# Patient Record
Sex: Female | Born: 1960 | Race: White | Hispanic: No | Marital: Single | Smoking: Former smoker
Health system: Southern US, Community
[De-identification: ages and names within clinical notes are randomized; demographics above are authoritative.]

## PROBLEM LIST (undated history)

## (undated) DIAGNOSIS — J45909 Unspecified asthma, uncomplicated: Secondary | ICD-10-CM

## (undated) DIAGNOSIS — R7303 Prediabetes: Secondary | ICD-10-CM

## (undated) DIAGNOSIS — K589 Irritable bowel syndrome without diarrhea: Secondary | ICD-10-CM

## (undated) DIAGNOSIS — F4310 Post-traumatic stress disorder, unspecified: Secondary | ICD-10-CM

## (undated) DIAGNOSIS — R51 Headache: Secondary | ICD-10-CM

## (undated) DIAGNOSIS — H269 Unspecified cataract: Secondary | ICD-10-CM

## (undated) DIAGNOSIS — G709 Myoneural disorder, unspecified: Secondary | ICD-10-CM

## (undated) DIAGNOSIS — E785 Hyperlipidemia, unspecified: Secondary | ICD-10-CM

## (undated) DIAGNOSIS — M2550 Pain in unspecified joint: Secondary | ICD-10-CM

## (undated) DIAGNOSIS — R519 Headache, unspecified: Secondary | ICD-10-CM

## (undated) DIAGNOSIS — K8020 Calculus of gallbladder without cholecystitis without obstruction: Secondary | ICD-10-CM

## (undated) DIAGNOSIS — H3530 Unspecified macular degeneration: Secondary | ICD-10-CM

## (undated) DIAGNOSIS — M1990 Unspecified osteoarthritis, unspecified site: Secondary | ICD-10-CM

## (undated) DIAGNOSIS — F32A Depression, unspecified: Secondary | ICD-10-CM

## (undated) HISTORY — DX: Hyperlipidemia, unspecified: E78.5

## (undated) HISTORY — DX: Unspecified cataract: H26.9

## (undated) HISTORY — DX: Unspecified osteoarthritis, unspecified site: M19.90

## (undated) HISTORY — PX: OTHER SURGICAL HISTORY: SHX169

## (undated) HISTORY — DX: Morbid (severe) obesity due to excess calories: E66.01

## (undated) HISTORY — DX: Prediabetes: R73.03

## (undated) HISTORY — PX: KNEE SURGERY: SHX244

## (undated) HISTORY — PX: CHOLECYSTECTOMY: SHX55

## (undated) HISTORY — PX: HERNIA REPAIR: SHX51

## (undated) HISTORY — DX: Pain in unspecified joint: M25.50

## (undated) HISTORY — PX: TUBAL LIGATION: SHX77

## (undated) HISTORY — DX: Irritable bowel syndrome, unspecified: K58.9

---

## 2000-06-08 HISTORY — PX: KNEE SURGERY: SHX244

## 2006-08-18 ENCOUNTER — Emergency Department (HOSPITAL_COMMUNITY): Admission: EM | Admit: 2006-08-18 | Discharge: 2006-08-18 | Payer: Self-pay | Admitting: Emergency Medicine

## 2008-01-17 ENCOUNTER — Emergency Department (HOSPITAL_COMMUNITY): Admission: EM | Admit: 2008-01-17 | Discharge: 2008-01-18 | Payer: Self-pay | Admitting: Emergency Medicine

## 2008-03-13 ENCOUNTER — Emergency Department (HOSPITAL_COMMUNITY): Admission: EM | Admit: 2008-03-13 | Discharge: 2008-03-14 | Payer: Self-pay | Admitting: Emergency Medicine

## 2008-05-13 ENCOUNTER — Emergency Department (HOSPITAL_COMMUNITY): Admission: EM | Admit: 2008-05-13 | Discharge: 2008-05-13 | Payer: Self-pay | Admitting: Emergency Medicine

## 2008-06-16 ENCOUNTER — Emergency Department (HOSPITAL_COMMUNITY): Admission: EM | Admit: 2008-06-16 | Discharge: 2008-06-16 | Payer: Self-pay | Admitting: Emergency Medicine

## 2008-07-31 ENCOUNTER — Encounter: Admission: RE | Admit: 2008-07-31 | Discharge: 2008-07-31 | Payer: Self-pay | Admitting: Obstetrics and Gynecology

## 2009-01-16 ENCOUNTER — Emergency Department (HOSPITAL_COMMUNITY): Admission: EM | Admit: 2009-01-16 | Discharge: 2009-01-16 | Payer: Self-pay | Admitting: Emergency Medicine

## 2009-01-19 ENCOUNTER — Emergency Department (HOSPITAL_COMMUNITY): Admission: EM | Admit: 2009-01-19 | Discharge: 2009-01-19 | Payer: Self-pay | Admitting: Emergency Medicine

## 2010-07-20 ENCOUNTER — Emergency Department (HOSPITAL_COMMUNITY)
Admission: EM | Admit: 2010-07-20 | Discharge: 2010-07-20 | Disposition: A | Discharge status: Home / Self Care | Payer: Self-pay | Attending: Emergency Medicine | Admitting: Emergency Medicine

## 2010-07-20 DIAGNOSIS — R1110 Vomiting, unspecified: Secondary | ICD-10-CM | POA: Insufficient documentation

## 2010-07-20 DIAGNOSIS — R5381 Other malaise: Secondary | ICD-10-CM | POA: Insufficient documentation

## 2010-07-20 DIAGNOSIS — R5383 Other fatigue: Secondary | ICD-10-CM | POA: Insufficient documentation

## 2010-07-20 DIAGNOSIS — K5289 Other specified noninfective gastroenteritis and colitis: Secondary | ICD-10-CM | POA: Insufficient documentation

## 2010-07-20 DIAGNOSIS — R059 Cough, unspecified: Secondary | ICD-10-CM | POA: Insufficient documentation

## 2010-07-20 DIAGNOSIS — R05 Cough: Secondary | ICD-10-CM | POA: Insufficient documentation

## 2010-07-20 DIAGNOSIS — R197 Diarrhea, unspecified: Secondary | ICD-10-CM | POA: Insufficient documentation

## 2010-07-20 DIAGNOSIS — J309 Allergic rhinitis, unspecified: Secondary | ICD-10-CM | POA: Insufficient documentation

## 2011-01-08 ENCOUNTER — Other Ambulatory Visit: Payer: Self-pay | Admitting: Family Medicine

## 2011-01-08 DIAGNOSIS — Z1231 Encounter for screening mammogram for malignant neoplasm of breast: Secondary | ICD-10-CM

## 2011-01-14 ENCOUNTER — Ambulatory Visit
Admission: RE | Admit: 2011-01-14 | Discharge: 2011-01-14 | Disposition: A | Discharge status: Home / Self Care | Payer: BC Managed Care – PPO | Source: Ambulatory Visit | Attending: Family Medicine | Admitting: Family Medicine

## 2011-01-14 DIAGNOSIS — Z1231 Encounter for screening mammogram for malignant neoplasm of breast: Secondary | ICD-10-CM

## 2016-06-01 ENCOUNTER — Emergency Department (HOSPITAL_COMMUNITY): Payer: Self-pay

## 2016-06-01 ENCOUNTER — Encounter (HOSPITAL_COMMUNITY): Payer: Self-pay | Admitting: Emergency Medicine

## 2016-06-01 ENCOUNTER — Emergency Department (HOSPITAL_COMMUNITY)
Admission: EM | Admit: 2016-06-01 | Discharge: 2016-06-01 | Disposition: A | Discharge status: Home / Self Care | Payer: Self-pay | Attending: Emergency Medicine | Admitting: Emergency Medicine

## 2016-06-01 DIAGNOSIS — Z87891 Personal history of nicotine dependence: Secondary | ICD-10-CM | POA: Insufficient documentation

## 2016-06-01 DIAGNOSIS — Y9389 Activity, other specified: Secondary | ICD-10-CM | POA: Insufficient documentation

## 2016-06-01 DIAGNOSIS — J45909 Unspecified asthma, uncomplicated: Secondary | ICD-10-CM | POA: Insufficient documentation

## 2016-06-01 DIAGNOSIS — Y999 Unspecified external cause status: Secondary | ICD-10-CM | POA: Insufficient documentation

## 2016-06-01 DIAGNOSIS — Y929 Unspecified place or not applicable: Secondary | ICD-10-CM | POA: Insufficient documentation

## 2016-06-01 DIAGNOSIS — W010XXA Fall on same level from slipping, tripping and stumbling without subsequent striking against object, initial encounter: Secondary | ICD-10-CM | POA: Insufficient documentation

## 2016-06-01 DIAGNOSIS — S42295A Other nondisplaced fracture of upper end of left humerus, initial encounter for closed fracture: Secondary | ICD-10-CM | POA: Insufficient documentation

## 2016-06-01 HISTORY — DX: Unspecified asthma, uncomplicated: J45.909

## 2016-06-01 MED ORDER — IBUPROFEN 200 MG PO TABS
400.0000 mg | ORAL_TABLET | Freq: Once | ORAL | Status: AC | PRN
  Administered 2016-06-01: 400 mg via ORAL
  Filled 2016-06-01: qty 2

## 2016-06-01 MED ORDER — HYDROCODONE-ACETAMINOPHEN 5-325 MG PO TABS
1.0000 | ORAL_TABLET | Freq: Once | ORAL | Status: AC
  Administered 2016-06-01: 1 via ORAL
  Filled 2016-06-01: qty 1

## 2016-06-01 MED ORDER — HYDROCODONE-ACETAMINOPHEN 5-325 MG PO TABS: 2.0000 | ORAL_TABLET | ORAL | 0 refills | Status: DC | PRN

## 2016-06-01 MED ORDER — CYCLOBENZAPRINE HCL 10 MG PO TABS: 5.0000 mg | ORAL_TABLET | Freq: Two times a day (BID) | ORAL | 0 refills | Status: DC | PRN

## 2016-06-01 MED ORDER — ONDANSETRON 4 MG PO TBDP
4.0000 mg | ORAL_TABLET | Freq: Once | ORAL | Status: AC
  Administered 2016-06-01: 4 mg via ORAL
  Filled 2016-06-01: qty 1

## 2016-06-01 MED ORDER — ONDANSETRON HCL 4 MG PO TABS: 4.0000 mg | ORAL_TABLET | Freq: Four times a day (QID) | ORAL | 0 refills | Status: DC

## 2016-06-01 NOTE — ED Triage Notes (Signed)
Pt was chasing her dog outside today and fell on her left shoulder on the grass.  Pt ambulatory in triage.  Denies LOC. Slight swelling noted on exam.  No erythema or bruising to area.  No obvious deformity noted.

## 2016-06-01 NOTE — ED Notes (Signed)
Patient transported to X-ray 

## 2016-06-01 NOTE — ED Provider Notes (Signed)
[facility]-EMERGENCY DEPT Provider Note   CSN: [id number] Arrival date & time: 06/01/16  1940  By signing my name below, I, Stephanie Hensley, attest that this documentation has been prepared under the direction and in the presence of Stephanie Hensley Zondra Lawlor, PA-C. Electronically Signed: Doreatha MartinEva Hensley, ED Scribe. 06/01/16. 8:37 PM.    History   Chief Complaint Chief Complaint  Patient presents with  . Shoulder Pain    HPI Stephanie Hensley is a 55 y.o. female who presents to the Emergency Department complaining of moderate left shoulder pain s/p mechanical fall that occurred one hour ago. Pt states she was chasing her dog when she tripped and fell directly onto her left shoulder. She denies LOC or head injury. She reports mild relief of pain with ice applied in the ED. She denies left wrist pain, left elbow pain, numbness, additional injuries.    The history is provided by the patient. No language interpreter was used.    Past Medical History:  Diagnosis Date  . Asthma     There are no active problems to display for this patient.   Past Surgical History:  Procedure Laterality Date  . CHOLECYSTECTOMY    . KNEE SURGERY    . TUBAL LIGATION      OB History    No data available       Home Medications    Prior to Admission medications   Medication Sig Start Date End Date Taking? Authorizing Provider  cyclobenzaprine (FLEXERIL) 10 MG tablet Take 0.5-1 tablets (5-10 mg total) by mouth 2 (two) times daily as needed for muscle spasms. 06/01/16   Stephanie Hensley Deaira Leckey, PA-C  HYDROcodone-acetaminophen (NORCO/VICODIN) 5-325 MG tablet Take 2 tablets by mouth every 4 (four) hours as needed. 06/01/16   Ishita Mcnerney Neva SeatGreene, PA-C  ondansetron (ZOFRAN) 4 MG tablet Take 1 tablet (4 mg total) by mouth every 6 (six) hours. 06/01/16   Stephanie Hensley Shaya Reddick, PA-C    Family History No family history on file.  Social History Social History  Substance Use Topics  . Smoking status: Former Smoker    Types: Cigarettes  . Smokeless  tobacco: Never Used  . Alcohol use No     Allergies   Patient has no known allergies.   Review of Systems Review of Systems  Musculoskeletal: Positive for arthralgias.  Neurological: Negative for syncope and numbness.  All other systems reviewed and are negative.    Physical Exam Updated Vital Signs BP (!) 153/106 (BP Location: Right Arm)   Pulse 94   Temp 98.4 F (36.9 C) (Oral)   Resp 20   Ht 5\' 5"  (1.651 m)   Wt 129 kg   LMP 12/24/2010   SpO2 98%   BMI 47.33 kg/m   Physical Exam  Constitutional: She appears well-developed and well-nourished.  HENT:  Head: Normocephalic.  Eyes: Conjunctivae are normal.  Cardiovascular: Normal rate.   Pulmonary/Chest: Effort normal. No respiratory distress.  Abdominal: She exhibits no distension.  Musculoskeletal: Normal range of motion. She exhibits tenderness.  Swelling, tenderness and decreased ROM to the left shoulder. No skin tinting. No pain to elbow, wrist or hand. Intact sensation and pulses. FROM of all 5 fingers.   Neurological: She is alert.  Skin: Skin is warm and dry.  Psychiatric: She has a normal mood and affect. Her behavior is normal.  Nursing note and vitals reviewed.    ED Treatments / Results   DIAGNOSTIC STUDIES: Oxygen Saturation is 98% on RA, normal by my interpretation.    COORDINATION OF CARE:  8:31 PM Discussed treatment plan with pt at bedside which includes XR, ibuprofen and pt agreed to plan.  Labs (all labs ordered are listed, but only abnormal results are displayed) Labs Reviewed - No data to display  EKG  EKG Interpretation None       Radiology Dg Shoulder Left  Result Date: 06/01/2016 CLINICAL DATA:  Recent fall with left shoulder pain, initial encounter EXAM: LEFT SHOULDER - 2+ VIEW COMPARISON:  None. FINDINGS: There is comminuted fracture of the left proximal humerus. It appears to involve the greater tuberosity as well as the anatomic neck. No dislocation of the humeral head  from the glenoid is noted. No other focal abnormality is seen. IMPRESSION: Comminuted proximal left humeral fracture. Electronically Signed   By: Alcide CleverMark  Lukens M.D.   On: 06/01/2016 20:14    Procedures Procedures (including critical care time)  Medications Ordered in ED Medications  HYDROcodone-acetaminophen (NORCO/VICODIN) 5-325 MG per tablet 1-2 tablet (not administered)  ondansetron (ZOFRAN-ODT) disintegrating tablet 4 mg (not administered)  ibuprofen (ADVIL,MOTRIN) tablet 400 mg (400 mg Oral Given 06/01/16 1955)     Initial Impression / Assessment and Plan / ED Course  I have reviewed the triage vital signs and the nursing notes.  Pertinent labs & imaging results that were available during my care of the patient were reviewed by me and considered in my medical decision making (see chart for details).  Clinical Course     Patient X-Ray shows comminuted proximal left humeral fracture.  Pt advised to follow up with orthopedics- told this may be a surgical fracture and that she needs to follow up to reduce risk of complications. Patient given ibuprofen, Vicodin while in ED, conservative therapy recommended and discussed. Patient will be discharged home with antiinflammatories and shoulder sling & is agreeable with above plan. Returns precautions discussed. Pt appears safe for discharge.    Final Clinical Impressions(s) / ED Diagnoses   Final diagnoses:  Other closed nondisplaced fracture of proximal end of left humerus, initial encounter    New Prescriptions New Prescriptions   CYCLOBENZAPRINE (FLEXERIL) 10 MG TABLET    Take 0.5-1 tablets (5-10 mg total) by mouth 2 (two) times daily as needed for muscle spasms.   HYDROCODONE-ACETAMINOPHEN (NORCO/VICODIN) 5-325 MG TABLET    Take 2 tablets by mouth every 4 (four) hours as needed.   ONDANSETRON (ZOFRAN) 4 MG TABLET    Take 1 tablet (4 mg total) by mouth every 6 (six) hours.   I personally performed the services described in this  documentation, which was scribed in my presence. The recorded information has been reviewed and is accurate.    Stephanie Hensley [name], PA-C 06/01/16 2109    Rolan BuccoMelanie Belfi, MD 06/01/16 2155

## 2016-06-17 ENCOUNTER — Encounter (HOSPITAL_COMMUNITY): Payer: Self-pay | Admitting: *Deleted

## 2016-06-17 NOTE — Progress Notes (Signed)
Spoke with pt for pre-op call. Pt denies cardiac history, chest pain or sob. 

## 2016-06-18 ENCOUNTER — Inpatient Hospital Stay (HOSPITAL_COMMUNITY): Payer: Self-pay | Admitting: Anesthesiology

## 2016-06-18 ENCOUNTER — Observation Stay (HOSPITAL_COMMUNITY): Payer: Self-pay

## 2016-06-18 ENCOUNTER — Encounter (HOSPITAL_COMMUNITY): Payer: Self-pay | Admitting: *Deleted

## 2016-06-18 ENCOUNTER — Observation Stay (HOSPITAL_COMMUNITY)
Admission: RE | Admit: 2016-06-18 | Discharge: 2016-06-19 | Disposition: A | Discharge status: Home / Self Care | Payer: Self-pay | Source: Ambulatory Visit | Attending: Orthopedic Surgery | Admitting: Orthopedic Surgery

## 2016-06-18 ENCOUNTER — Encounter (HOSPITAL_COMMUNITY)
Admission: RE | Disposition: A | Discharge status: Home / Self Care | Payer: Self-pay | Source: Ambulatory Visit | Attending: Orthopedic Surgery

## 2016-06-18 DIAGNOSIS — S42242A 4-part fracture of surgical neck of left humerus, initial encounter for closed fracture: Principal | ICD-10-CM | POA: Insufficient documentation

## 2016-06-18 DIAGNOSIS — J45909 Unspecified asthma, uncomplicated: Secondary | ICD-10-CM | POA: Insufficient documentation

## 2016-06-18 DIAGNOSIS — W19XXXA Unspecified fall, initial encounter: Secondary | ICD-10-CM | POA: Insufficient documentation

## 2016-06-18 DIAGNOSIS — S42292A Other displaced fracture of upper end of left humerus, initial encounter for closed fracture: Secondary | ICD-10-CM

## 2016-06-18 DIAGNOSIS — Z87891 Personal history of nicotine dependence: Secondary | ICD-10-CM | POA: Insufficient documentation

## 2016-06-18 DIAGNOSIS — F4310 Post-traumatic stress disorder, unspecified: Secondary | ICD-10-CM | POA: Insufficient documentation

## 2016-06-18 HISTORY — DX: Post-traumatic stress disorder, unspecified: F43.10

## 2016-06-18 HISTORY — DX: Headache: R51

## 2016-06-18 HISTORY — DX: Headache, unspecified: R51.9

## 2016-06-18 HISTORY — PX: SHOULDER HEMI-ARTHROPLASTY: SHX5049

## 2016-06-18 LAB — CBC
HCT: 36.9 % (ref 36.0–46.0)
HCT: 34.9 % — ABNORMAL LOW (ref 36.0–46.0)
Hemoglobin: 12 g/dL (ref 12.0–15.0)
Hemoglobin: 11.1 g/dL — ABNORMAL LOW (ref 12.0–15.0)
MCH: 29.1 pg (ref 26.0–34.0)
MCH: 28.8 pg (ref 26.0–34.0)
MCHC: 32.5 g/dL (ref 30.0–36.0)
MCHC: 31.8 g/dL (ref 30.0–36.0)
MCV: 89.6 fL (ref 78.0–100.0)
MCV: 90.4 fL (ref 78.0–100.0)
PLATELETS: 286 10*3/uL (ref 150–400)
PLATELETS: 250 10*3/uL (ref 150–400)
RBC: 4.12 MIL/uL (ref 3.87–5.11)
RBC: 3.86 MIL/uL — ABNORMAL LOW (ref 3.87–5.11)
RDW: 13.1 % (ref 11.5–15.5)
RDW: 13.3 % (ref 11.5–15.5)
WBC: 7.6 10*3/uL (ref 4.0–10.5)
WBC: 11.7 10*3/uL — ABNORMAL HIGH (ref 4.0–10.5)

## 2016-06-18 LAB — CREATININE, SERUM
Creatinine, Ser: 0.81 mg/dL (ref 0.44–1.00)
GFR calc Af Amer: >60 mL/min
GFR calc non Af Amer: >60 mL/min

## 2016-06-18 SURGERY — HEMIARTHROPLASTY, SHOULDER
Anesthesia: General | Site: Shoulder | Laterality: Left

## 2016-06-18 MED ORDER — DOCUSATE SODIUM 100 MG PO CAPS
100.0000 mg | ORAL_CAPSULE | Freq: Two times a day (BID) | ORAL | Status: DC
  Filled 2016-06-18: qty 1

## 2016-06-18 MED ORDER — ROCURONIUM BROMIDE 50 MG/5ML IV SOSY
PREFILLED_SYRINGE | INTRAVENOUS | Status: AC
  Filled 2016-06-18: qty 25

## 2016-06-18 MED ORDER — PROPOFOL 10 MG/ML IV BOLUS
INTRAVENOUS | Status: DC | PRN
  Administered 2016-06-18: 200 mg via INTRAVENOUS

## 2016-06-18 MED ORDER — MIDAZOLAM HCL 2 MG/2ML IJ SOLN
INTRAMUSCULAR | Status: AC
  Filled 2016-06-18: qty 2

## 2016-06-18 MED ORDER — LACTATED RINGERS IV SOLN
INTRAVENOUS | Status: DC | PRN
  Administered 2016-06-18 (×2): via INTRAVENOUS

## 2016-06-18 MED ORDER — BUPIVACAINE HCL (PF) 0.25 % IJ SOLN
INTRAMUSCULAR | Status: DC | PRN
  Administered 2016-06-18: 20 mL

## 2016-06-18 MED ORDER — PROPOFOL 10 MG/ML IV BOLUS
INTRAVENOUS | Status: AC
  Filled 2016-06-18: qty 20

## 2016-06-18 MED ORDER — HYDROMORPHONE HCL 1 MG/ML IJ SOLN
INTRAMUSCULAR | Status: AC
  Filled 2016-06-18: qty 1

## 2016-06-18 MED ORDER — ONDANSETRON HCL 4 MG/2ML IJ SOLN: 4.0000 mg | Freq: Four times a day (QID) | INTRAMUSCULAR | Status: DC | PRN

## 2016-06-18 MED ORDER — PHENYLEPHRINE HCL 10 MG/ML IJ SOLN
INTRAMUSCULAR | Status: AC
  Filled 2016-06-18: qty 1

## 2016-06-18 MED ORDER — ONDANSETRON HCL 4 MG PO TABS: 4.0000 mg | ORAL_TABLET | Freq: Four times a day (QID) | ORAL | Status: DC | PRN

## 2016-06-18 MED ORDER — FENTANYL CITRATE (PF) 100 MCG/2ML IJ SOLN
INTRAMUSCULAR | Status: AC
  Filled 2016-06-18: qty 2

## 2016-06-18 MED ORDER — LIDOCAINE HCL (CARDIAC) 20 MG/ML IV SOLN
INTRAVENOUS | Status: DC | PRN
  Administered 2016-06-18: 80 mg via INTRAVENOUS

## 2016-06-18 MED ORDER — MIDAZOLAM HCL 5 MG/5ML IJ SOLN
INTRAMUSCULAR | Status: DC | PRN
  Administered 2016-06-18: 2 mg via INTRAVENOUS

## 2016-06-18 MED ORDER — ONDANSETRON HCL 4 MG/2ML IJ SOLN
INTRAMUSCULAR | Status: DC | PRN
  Administered 2016-06-18: 4 mg via INTRAVENOUS

## 2016-06-18 MED ORDER — MORPHINE SULFATE (PF) 2 MG/ML IV SOLN
2.0000 mg | INTRAVENOUS | Status: DC | PRN
Start: 2016-06-18 — End: 2016-06-19

## 2016-06-18 MED ORDER — DEXAMETHASONE SODIUM PHOSPHATE 10 MG/ML IJ SOLN
INTRAMUSCULAR | Status: DC | PRN
  Administered 2016-06-18: 10 mg via INTRAVENOUS

## 2016-06-18 MED ORDER — SODIUM CHLORIDE 0.9 % IR SOLN
Status: DC | PRN
Start: 2016-06-18 — End: 2016-06-18
  Administered 2016-06-18: 1000 mL

## 2016-06-18 MED ORDER — ACETAMINOPHEN 650 MG RE SUPP: 650.0000 mg | Freq: Four times a day (QID) | RECTAL | Status: DC | PRN

## 2016-06-18 MED ORDER — OXYCODONE HCL 5 MG PO TABS
5.0000 mg | ORAL_TABLET | ORAL | Status: DC | PRN
  Administered 2016-06-18 (×2): 10 mg via ORAL
  Administered 2016-06-19 (×2): 5 mg via ORAL
  Administered 2016-06-19: 10 mg via ORAL
  Filled 2016-06-18 (×2): qty 2
  Filled 2016-06-18: qty 1
  Filled 2016-06-18: qty 2
  Filled 2016-06-18: qty 1

## 2016-06-18 MED ORDER — SUGAMMADEX SODIUM 500 MG/5ML IV SOLN
INTRAVENOUS | Status: DC | PRN
  Administered 2016-06-18: 500 mg via INTRAVENOUS

## 2016-06-18 MED ORDER — LORATADINE 10 MG PO TABS
10.0000 mg | ORAL_TABLET | Freq: Every day | ORAL | Status: DC
  Administered 2016-06-19: 10 mg via ORAL
  Filled 2016-06-18 (×2): qty 1

## 2016-06-18 MED ORDER — FENTANYL CITRATE (PF) 100 MCG/2ML IJ SOLN
INTRAMUSCULAR | Status: DC | PRN
  Administered 2016-06-18: 50 ug via INTRAVENOUS
  Administered 2016-06-18: 100 ug via INTRAVENOUS
  Administered 2016-06-18: 50 ug via INTRAVENOUS

## 2016-06-18 MED ORDER — ACETAMINOPHEN 10 MG/ML IV SOLN
INTRAVENOUS | Status: AC
  Filled 2016-06-18: qty 100

## 2016-06-18 MED ORDER — ACETAMINOPHEN 325 MG PO TABS
650.0000 mg | ORAL_TABLET | Freq: Four times a day (QID) | ORAL | Status: DC | PRN
Start: 2016-06-18 — End: 2016-06-19

## 2016-06-18 MED ORDER — HYDROMORPHONE HCL 1 MG/ML IJ SOLN
0.2500 mg | INTRAMUSCULAR | Status: DC | PRN
  Administered 2016-06-18: 0.5 mg via INTRAVENOUS

## 2016-06-18 MED ORDER — ENOXAPARIN SODIUM 40 MG/0.4ML ~~LOC~~ SOLN
40.0000 mg | SUBCUTANEOUS | Status: DC
  Administered 2016-06-19: 40 mg via SUBCUTANEOUS
  Filled 2016-06-18: qty 0.4

## 2016-06-18 MED ORDER — BUPIVACAINE HCL (PF) 0.25 % IJ SOLN
INTRAMUSCULAR | Status: AC
  Filled 2016-06-18: qty 30

## 2016-06-18 MED ORDER — CHLORHEXIDINE GLUCONATE 4 % EX LIQD: 60.0000 mL | Freq: Once | CUTANEOUS | Status: DC

## 2016-06-18 MED ORDER — ACETAMINOPHEN 500 MG PO TABS
1000.0000 mg | ORAL_TABLET | Freq: Four times a day (QID) | ORAL | Status: DC
  Administered 2016-06-18 – 2016-06-19 (×3): 1000 mg via ORAL
  Filled 2016-06-18 (×4): qty 2

## 2016-06-18 MED ORDER — SUCCINYLCHOLINE CHLORIDE 200 MG/10ML IV SOSY
PREFILLED_SYRINGE | INTRAVENOUS | Status: AC
  Filled 2016-06-18: qty 10

## 2016-06-18 MED ORDER — ACETAMINOPHEN 10 MG/ML IV SOLN
INTRAVENOUS | Status: DC | PRN
  Administered 2016-06-18: 1000 mg via INTRAVENOUS

## 2016-06-18 MED ORDER — DEXTROSE 5 % IV SOLN
3.0000 g | INTRAVENOUS | Status: AC
  Administered 2016-06-18: 2 g via INTRAVENOUS
  Filled 2016-06-18: qty 3000

## 2016-06-18 MED ORDER — SUCCINYLCHOLINE CHLORIDE 20 MG/ML IJ SOLN
INTRAMUSCULAR | Status: DC | PRN
  Administered 2016-06-18: 100 mg via INTRAVENOUS

## 2016-06-18 MED ORDER — SUGAMMADEX SODIUM 500 MG/5ML IV SOLN
INTRAVENOUS | Status: AC
  Filled 2016-06-18: qty 5

## 2016-06-18 MED ORDER — ALBUTEROL SULFATE (2.5 MG/3ML) 0.083% IN NEBU: 3.0000 mL | INHALATION_SOLUTION | Freq: Four times a day (QID) | RESPIRATORY_TRACT | Status: DC | PRN

## 2016-06-18 MED ORDER — DEXAMETHASONE SODIUM PHOSPHATE 10 MG/ML IJ SOLN
INTRAMUSCULAR | Status: AC
  Filled 2016-06-18: qty 1

## 2016-06-18 MED ORDER — CYCLOBENZAPRINE HCL 5 MG PO TABS
5.0000 mg | ORAL_TABLET | Freq: Three times a day (TID) | ORAL | Status: DC | PRN
  Filled 2016-06-18: qty 2

## 2016-06-18 MED ORDER — LIDOCAINE 2% (20 MG/ML) 5 ML SYRINGE
INTRAMUSCULAR | Status: AC
  Filled 2016-06-18: qty 10

## 2016-06-18 MED ORDER — EPHEDRINE 5 MG/ML INJ
INTRAVENOUS | Status: AC
  Filled 2016-06-18: qty 10

## 2016-06-18 MED ORDER — ROCURONIUM BROMIDE 100 MG/10ML IV SOLN
INTRAVENOUS | Status: DC | PRN
  Administered 2016-06-18: 20 mg via INTRAVENOUS
  Administered 2016-06-18: 30 mg via INTRAVENOUS
  Administered 2016-06-18: 20 mg via INTRAVENOUS
  Administered 2016-06-18: 10 mg via INTRAVENOUS
  Administered 2016-06-18: 20 mg via INTRAVENOUS

## 2016-06-18 MED ORDER — PHENYLEPHRINE HCL 10 MG/ML IJ SOLN
INTRAVENOUS | Status: DC | PRN
  Administered 2016-06-18: 25 ug/min via INTRAVENOUS

## 2016-06-18 MED ORDER — LACTATED RINGERS IV SOLN
INTRAVENOUS | Status: DC
  Administered 2016-06-18: 10:00:00 via INTRAVENOUS

## 2016-06-18 MED ORDER — MIDAZOLAM HCL 5 MG/5ML IJ SOLN: INTRAMUSCULAR | Status: DC | PRN

## 2016-06-18 SURGICAL SUPPLY — 59 items
BLADE SAW SAG 73X25 THK (BLADE) ×1
BLADE SAW SGTL 73X25 THK (BLADE) ×1 IMPLANT
BONE CEMENT PALACOS R-G (Cement) ×2 IMPLANT
BOWL SMART MIX CTS (DISPOSABLE) ×2 IMPLANT
CAPT SHOULDER PARTIAL 2 ×1 IMPLANT
CEMENT BONE PALACOS R-G (Cement) IMPLANT
COVER SURGICAL LIGHT HANDLE (MISCELLANEOUS) ×2 IMPLANT
DRAPE IMP U-DRAPE 54X76 (DRAPES) ×2 IMPLANT
DRAPE INCISE IOBAN 66X45 STRL (DRAPES) ×4 IMPLANT
DRAPE U-SHAPE 47X51 STRL (DRAPES) ×2 IMPLANT
DRILL BIT 5/64 (BIT) ×2 IMPLANT
DRSG ADAPTIC 3X8 NADH LF (GAUZE/BANDAGES/DRESSINGS) ×2 IMPLANT
DRSG PAD ABDOMINAL 8X10 ST (GAUZE/BANDAGES/DRESSINGS) ×4 IMPLANT
DURAPREP 26ML APPLICATOR (WOUND CARE) ×2 IMPLANT
ELECT NDL TIP 2.8 STRL (NEEDLE) ×1 IMPLANT
ELECT NEEDLE TIP 2.8 STRL (NEEDLE) ×2 IMPLANT
ELECT REM PT RETURN 9FT ADLT (ELECTROSURGICAL) ×2
ELECTRODE REM PT RTRN 9FT ADLT (ELECTROSURGICAL) ×1 IMPLANT
GAUZE SPONGE 4X4 12PLY STRL (GAUZE/BANDAGES/DRESSINGS) ×2 IMPLANT
GAUZE XEROFORM 1X8 LF (GAUZE/BANDAGES/DRESSINGS) ×1 IMPLANT
GLOVE BIOGEL PI ORTHO PRO 7.5 (GLOVE) ×1
GLOVE BIOGEL PI ORTHO PRO SZ8 (GLOVE) ×1
GLOVE ORTHO TXT STRL SZ7.5 (GLOVE) ×2 IMPLANT
GLOVE PI ORTHO PRO STRL 7.5 (GLOVE) ×1 IMPLANT
GLOVE PI ORTHO PRO STRL SZ8 (GLOVE) ×1 IMPLANT
GLOVE SURG ORTHO 8.5 STRL (GLOVE) ×2 IMPLANT
GOWN STRL REUS W/ TWL XL LVL3 (GOWN DISPOSABLE) ×2 IMPLANT
GOWN STRL REUS W/TWL XL LVL3 (GOWN DISPOSABLE) ×4
KIT BASIN OR (CUSTOM PROCEDURE TRAY) ×2 IMPLANT
KIT ROOM TURNOVER OR (KITS) ×2 IMPLANT
MANIFOLD NEPTUNE II (INSTRUMENTS) ×2 IMPLANT
NDL HYPO 25GX1X1/2 BEV (NEEDLE) ×1 IMPLANT
NDL SUT .5 MAYO 1.404X.05X (NEEDLE) ×1 IMPLANT
NEEDLE HYPO 25GX1X1/2 BEV (NEEDLE) ×2 IMPLANT
NEEDLE MAYO TAPER (NEEDLE) ×2
NS IRRIG 1000ML POUR BTL (IV SOLUTION) ×2 IMPLANT
PACK SHOULDER (CUSTOM PROCEDURE TRAY) ×2 IMPLANT
PACK UNIVERSAL I (CUSTOM PROCEDURE TRAY) ×2 IMPLANT
PAD ARMBOARD 7.5X6 YLW CONV (MISCELLANEOUS) ×4 IMPLANT
SLING ARM IMMOBILIZER LRG (SOFTGOODS) ×2 IMPLANT
SLING ARM IMMOBILIZER MED (SOFTGOODS) IMPLANT
SPONGE GAUZE 4X4 12PLY STER LF (GAUZE/BANDAGES/DRESSINGS) ×1 IMPLANT
SPONGE LAP 18X18 X RAY DECT (DISPOSABLE) ×2 IMPLANT
STRIP CLOSURE SKIN 1/2X4 (GAUZE/BANDAGES/DRESSINGS) ×2 IMPLANT
SUCTION FRAZIER HANDLE 10FR (MISCELLANEOUS) ×1
SUCTION TUBE FRAZIER 10FR DISP (MISCELLANEOUS) ×1 IMPLANT
SUT FIBERWIRE #2 38 T-5 BLUE (SUTURE) ×10
SUT MNCRL AB 4-0 PS2 18 (SUTURE) ×2 IMPLANT
SUT VIC AB 0 CT1 27 (SUTURE) ×2
SUT VIC AB 0 CT1 27XBRD ANBCTR (SUTURE) ×1 IMPLANT
SUT VIC AB 2-0 CT1 27 (SUTURE) ×4
SUT VIC AB 2-0 CT1 TAPERPNT 27 (SUTURE) ×1 IMPLANT
SUTURE FIBERWR #2 38 T-5 BLUE (SUTURE) ×2 IMPLANT
SYR CONTROL 10ML LL (SYRINGE) ×2 IMPLANT
TOWEL OR 17X24 6PK STRL BLUE (TOWEL DISPOSABLE) ×2 IMPLANT
TOWEL OR 17X26 10 PK STRL BLUE (TOWEL DISPOSABLE) ×2 IMPLANT
TRAY FOLEY CATH 16FRSI W/METER (SET/KITS/TRAYS/PACK) IMPLANT
WATER STERILE IRR 1000ML POUR (IV SOLUTION) ×2 IMPLANT
YANKAUER SUCT BULB TIP NO VENT (SUCTIONS) ×1 IMPLANT

## 2016-06-18 NOTE — H&P (Signed)
ORTHOPAEDIC H and P  REQUESTING PHYSICIAN: Yolonda KidaJason Patrick Donnivan Villena, MD  PCP:  No PCP Per Patient  Chief Complaint: Left shoulder pain/fracture  HPI: Stephanie Hensley is a 56 y.o. female who complains of  Left proximal humerus fracture.  She presents today for operative management of the left shoulder. She has been having consistent pain but no distal neuro deficits and is ready for surgery today.    Past Medical History:  Diagnosis Date  . Asthma   . Headache    migraines in the past  . PTSD (post-traumatic stress disorder)    Past Surgical History:  Procedure Laterality Date  . CHOLECYSTECTOMY    . HERNIA REPAIR     umbilical hernia repair  . KNEE SURGERY    . TUBAL LIGATION     Social History   Social History  . Marital status: Single    Spouse name: N/A  . Number of children: N/A  . Years of education: N/A   Social History Main Topics  . Smoking status: Former Smoker    Types: Cigarettes    Quit date: 06/17/2012  . Smokeless tobacco: Never Used  . Alcohol use No  . Drug use: No  . Sexual activity: Not Asked   Other Topics Concern  . None   Social History Narrative  . None   Family History  Problem Relation Age of Onset  . Cancer Mother   . Heart disease Father    No Known Allergies Prior to Admission medications   Medication Sig Start Date End Date Taking? Authorizing Provider  albuterol (PROVENTIL HFA;VENTOLIN HFA) 108 (90 Base) MCG/ACT inhaler Inhale 1-2 puffs into the lungs every 6 (six) hours as needed for wheezing or shortness of breath.   Yes Historical Provider, MD  cetirizine (ZYRTEC) 10 MG tablet Take 10 mg by mouth daily.   Yes Historical Provider, MD  cyclobenzaprine (FLEXERIL) 10 MG tablet Take 0.5-1 tablets (5-10 mg total) by mouth 2 (two) times daily as needed for muscle spasms. 06/01/16  Yes Tiffany Neva SeatGreene, PA-C  diphenhydrAMINE (BENADRYL) 25 MG tablet Take 25 mg by mouth every 6 (six) hours as needed. Pt taking 1 a day for itching caused by  Hydrocodone   Yes Historical Provider, MD  HYDROcodone-acetaminophen (NORCO/VICODIN) 5-325 MG tablet Take 2 tablets by mouth every 4 (four) hours as needed. Patient taking differently: Take 2 tablets by mouth every 4 (four) hours as needed (for pain).  06/01/16  Yes Tiffany Neva SeatGreene, PA-C  ondansetron (ZOFRAN) 4 MG tablet Take 1 tablet (4 mg total) by mouth every 6 (six) hours. Patient not taking: Reported on 06/12/2016 06/01/16   Marlon Peliffany Greene, PA-C   No results found.  Positive ROS: All other systems have been reviewed and were otherwise negative with the exception of those mentioned in the HPI and as above.  Physical Exam: General: Alert, no acute distress Cardiovascular: No pedal edema Respiratory: No cyanosis, no use of accessory musculature GI: No organomegaly, abdomen is soft and non-tender Skin: No lesions in the area of chief complaint Neurologic: Sensation intact distally Psychiatric: Patient is competent for consent with normal mood and affect Lymphatic: No axillary or cervical lymphadenopathy  MUSCULOSKELETAL:  LUE- TTP proximally, otherwise nvi distally   Assessment: Left 4 part proximal humerus fracture with head splitting component  Plan: -to OR today for Left shoulder hemiarthroplasty -The risks, benefits, and alternatives were discussed with the patient. There are risks associated with the surgery including, but not limited to, problems with anesthesia (death),  infection, differences in leg length/angulation/rotation, fracture of bones, loosening or failure of implants, malunion, nonunion, hematoma (blood accumulation) which may require surgical drainage, blood clots, pulmonary embolism, nerve injury (foot drop), and blood vessel injury. The patient understands these risks and elects to proceed. -admit to obs post op     Yolonda Kida, MD Cell [phone]    06/18/2016 12:12 PM

## 2016-06-18 NOTE — Anesthesia Procedure Notes (Signed)
Procedure Name: Intubation Date/Time: 06/18/2016 12:31 PM Performed by: Neldon Newport Pre-anesthesia Checklist: Timeout performed, Patient being monitored, Suction available, Emergency Drugs available and Patient identified Patient Re-evaluated:Patient Re-evaluated prior to inductionOxygen Delivery Method: Circle system utilized Preoxygenation: Pre-oxygenation with 100% oxygen Intubation Type: IV induction and Rapid sequence Ventilation: Mask ventilation without difficulty and Oral airway inserted - appropriate to patient size Laryngoscope Size: Mac and 3 Grade View: Grade I Tube type: Oral Tube size: 7.0 mm Number of attempts: 1 Placement Confirmation: breath sounds checked- equal and bilateral,  positive ETCO2 and ETT inserted through vocal cords under direct vision Secured at: 22 cm Tube secured with: Tape Dental Injury: Teeth and Oropharynx as per pre-operative assessment

## 2016-06-18 NOTE — Anesthesia Preprocedure Evaluation (Addendum)
Anesthesia Evaluation  Patient identified by MRN, date of birth, ID band Patient awake  General Assessment Comment:Patient refused block. CG  Reviewed: Allergy & Precautions, NPO status , Patient's Chart, lab work & pertinent test results  Airway Mallampati: II  TM Distance: <3 FB Neck ROM: full    Dental  (+) Teeth Intact, Dental Advidsory Given   Pulmonary asthma , former smoker,    breath sounds clear to auscultation       Cardiovascular negative cardio ROS   Rhythm:regular     Neuro/Psych  Headaches, PSYCHIATRIC DISORDERS    GI/Hepatic negative GI ROS, Neg liver ROS,   Endo/Other  negative endocrine ROS  Renal/GU negative Renal ROS     Musculoskeletal   Abdominal   Peds  Hematology   Anesthesia Other Findings   Reproductive/Obstetrics                           Anesthesia Physical Anesthesia Plan  ASA: III  Anesthesia Plan: General   Post-op Pain Management:  Regional for Post-op pain   Induction:   Airway Management Planned: Oral ETT  Additional Equipment:   Intra-op Plan:   Post-operative Plan: Extubation in OR  Informed Consent: I have reviewed the patients History and Physical, chart, labs and discussed the procedure including the risks, benefits and alternatives for the proposed anesthesia with the patient or authorized representative who has indicated his/her understanding and acceptance.   Dental Advisory Given and Dental advisory given  Plan Discussed with: Anesthesiologist, CRNA and Surgeon  Anesthesia Plan Comments:        Anesthesia Quick Evaluation

## 2016-06-18 NOTE — Op Note (Signed)
Date of Surgery: 06/18/2016  INDICATIONS: Ms. Stephanie Hensley is a 56 y.o.-year-old female with a left 4 part proximal humerus fracture with head splitting component based on CT scan;  The patient did consent to the procedure after discussion of the risks and benefits.  We discussed specifically the hemiarthroplasty option because of the high risk of humeral head AVN and the unique risk of tuberosity nonunion with the hemiarthroplasty option and need for further surgery in her future, but hopefully not in the near future.  PREOPERATIVE DIAGNOSIS:Left  4 part proximal humerus fracture  POSTOPERATIVE DIAGNOSIS: Same.  PROCEDURE: Left shoulder hemiarthroplasty for fracture  SURGEON: Stephanie Hensley, M.D.  ASSIST: none.  ANESTHESIA:  general  IV FLUIDS AND URINE: See anesthesia.  ESTIMATED BLOOD LOSS: 100 mL.  IMPLANTS: Biomet size 6 fracture stem with size 42mm head  DRAINS: none  COMPLICATIONS: None.  DESCRIPTION OF PROCEDURE: The patient was brought to the operating room and placed supine on the operating table and raised into the beach chair position.  The patient had been signed prior to the procedure and this was documented. The patient had the anesthesia placed by the anesthesiologist.  A time-out was performed to confirm that this was the correct patient, site, side and location. The patient did receive antibiotics prior to the incision and was re-dosed during the procedure as needed at indicated intervals.  A tourniquet was not placed.  The patient had the operative extremity prepped and draped in the standard surgical fashion.     A standard deltopectoral incision was carried out and the cephalic vein was taken laterally with the deltoid.  Blunt dissection was then used to develop the interval between deltoid and pectoralis major.  A self retainer was placed to expose the clavipectoral fascia overlying the conjoined tendon medially and the anterior humerus and biceps groove more laterally.  The  fascia was opened and bluntly I developed the plane directly laterally to the conjoined tendon.  Next I excised the organized fracture hematoma that was extensively surrounding the entire proximal humerus.  We then, dissected the long head of the biceps tendon out of its proximal position in the groove and tenotomized it there.  The distal portion was excised.  The proximal stump was followed into the fracture line that split the greater and lesser tuberosities.  It was noted at this time that a large humeral head split was present with more than half of the head attached to the lesser tuberosity and a large free portion inferiorly and another large piece attached to the greater tuberosity fragment that also had an inferior spike that had a large portion of lateral cortex attached.  The surgical neck fracture line exited medial at the top of the calcar.  At this juncture the head fractures were removed piecemeal and placed on the back table for later autografting.  The lesser and greater tuberosity pieces were secured with #2 Fiber wire stitches and then debulked of their cancellous bone.  Next we inspected the glenoid for fractures.  There was a small non displaced, fracture anteroinferiorly that was stable to manipulation and not fixed separately.  Then were turned to the humeral side.  The canal was entered with an opening reamer and was sequentially reamed until we had good cortical chatter.  This was at a size 9.  Our fracture stem only came in an 8 or a 10 so we elected to cement the 6 stem in place at the proper height.  We next inserted a  cement restrictor for the 8 reamer and trialed a size 6 stem with the 42 mm head.  This provided a height from the top of the pectoralis major tendon to the top of the humeral head of approximately 5.5 cm, which is shown to be appropriate.  We irrigated the humerus and wound be copiously in preparation for the implantation.  Using bone cement we created a proximal  cement mantle and implanted the final stem to its trialed height and at 30 degrees of retroversion.  Once cement dried we trailed on last time and were satisfied with the height.  The 42 mm humeral head was inserted at the appropriate eccentricity and reduced one final time.    Finally we secured the tuberosity pieces back to the shaft and prosthesis.  This was accomplish with suture passes through the fins of the implant and to each tuberosity and also from pre-drilled holes in the shaft and up to each tuberosity for vertical stability using #2 fiberwire suture.  Prior to cinching down the tuberosities, we applied cancellous autograft from the humeral head under the pieces to encourage excellent bony healing.  The arm was ranged and the tuberosities were found to move as a single unit with the humerus.  For additional security I placed a tension suture in the lateral most aspect of the rotator interval, with helped to cover the head nicely.  The wound was irrigated one last time and hemostasis was ensured.  The cephalic vein was still intact.  The wound was closed using 0 Vicryl for the deep fat layer, 2-0 vicryl for the subcutaneous layer and 3-0 Monocryl subcuticular running for the skin.  Marland Kitchen025% Marcaine was injected into the incision.  Steri strips applied and a sterile dressing.  The arm was cleaned and placed in a sling.  All counts were correct at the final closure and there were no complications.  The patient awoke from GETA and was transferred to the recovery room in stable condition.  POSTOPERATIVE PLAN: Stephanie Hensley will be NWB to the left arm for approximately 6 weeks.  We will have her in the sling for approximately 8 weeks.  She will be admitted to the floor for pain control and to follow up on post op CBC.  She will receive DVT ppx in house with Lovenox and SCDs and dc home on daily asa.

## 2016-06-18 NOTE — Transfer of Care (Signed)
Immediate Anesthesia Transfer of Care Note  Patient: Adela LankKimberly Rea  Procedure(s) Performed: Procedure(s): LEFT SHOULDER HEMI-ARTHROPLASTY (Left)  Patient Location: PACU  Anesthesia Type:General  Level of Consciousness: responds to stimulation  Airway & Oxygen Therapy: Patient Spontanous Breathing and Patient connected to nasal cannula oxygen  Post-op Assessment: Report given to RN and Post -op Vital signs reviewed and stable  Post vital signs: Reviewed and stable  Last Vitals:  Vitals:   06/18/16 1130 06/18/16 1538  BP: (!) 144/64   Pulse: 79   Resp: 18   Temp:  (P) 36.7 C    Last Pain:  Vitals:   06/18/16 1538  PainSc: (P) Asleep      Patients Stated Pain Goal: 4 (06/18/16 0949)  Complications: No apparent anesthesia complications

## 2016-06-18 NOTE — Brief Op Note (Signed)
06/18/2016  3:28 PM  PATIENT:  Stephanie Hensley  56 y.o. female  PRE-OPERATIVE DIAGNOSIS:  Left proximal humerus fracture  POST-OPERATIVE DIAGNOSIS:  left proximal humerus fracture  PROCEDURE:  Procedure(s): LEFT SHOULDER HEMI-ARTHROPLASTY (Left)  SURGEON:  Surgeon(s) and Role:    * Yolonda KidaJason Patrick Laurenashley Viar, MD - Primary  PHYSICIAN ASSISTANT:   ASSISTANTS: none   ANESTHESIA:   general  EBL:  Total I/O In: 1000 [I.V.:1000] Out: 500 [Blood:500]  BLOOD ADMINISTERED:none  DRAINS: none   LOCAL MEDICATIONS USED:  MARCAINE     SPECIMEN:  No Specimen  DISPOSITION OF SPECIMEN:  N/A  COUNTS:  YES  TOURNIQUET:  * No tourniquets in log *  DICTATION: .Note written in EPIC  PLAN OF CARE: Admit for overnight observation  PATIENT DISPOSITION:  PACU - hemodynamically stable.   Delay start of Pharmacological VTE agent (>24hrs) due to surgical blood loss or risk of bleeding: not applicable

## 2016-06-18 NOTE — Anesthesia Postprocedure Evaluation (Signed)
Anesthesia Post Note  Patient: Adela LankKimberly Biehler  Procedure(s) Performed: Procedure(s) (LRB): LEFT SHOULDER HEMI-ARTHROPLASTY (Left)  Patient location during evaluation: PACU Anesthesia Type: General Level of consciousness: awake Pain management: pain level controlled Respiratory status: spontaneous breathing Cardiovascular status: stable Anesthetic complications: no       Last Vitals:  Vitals:   06/18/16 1536 06/18/16 1538  BP: (!) 115/44   Pulse: 91   Resp: 18   Temp:  36.7 C    Last Pain:  Vitals:   06/18/16 1538  PainSc: Asleep                 Shawntell Dixson

## 2016-06-19 ENCOUNTER — Encounter (HOSPITAL_COMMUNITY): Payer: Self-pay | Admitting: Orthopedic Surgery

## 2016-06-19 LAB — CBC
HCT: 33.5 % — ABNORMAL LOW (ref 36.0–46.0)
HEMOGLOBIN: 10.6 g/dL — AB (ref 12.0–15.0)
MCH: 28.4 pg (ref 26.0–34.0)
MCHC: 31.6 g/dL (ref 30.0–36.0)
MCV: 89.8 fL (ref 78.0–100.0)
PLATELETS: 312 10*3/uL (ref 150–400)
RBC: 3.73 MIL/uL — AB (ref 3.87–5.11)
RDW: 13.1 % (ref 11.5–15.5)
WBC: 14.4 10*3/uL — ABNORMAL HIGH (ref 4.0–10.5)

## 2016-06-19 LAB — GLUCOSE, CAPILLARY: GLUCOSE-CAPILLARY: 113 mg/dL — AB (ref 65–99)

## 2016-06-19 MED ORDER — OXYCODONE HCL 5 MG PO TABS: 5.0000 mg | ORAL_TABLET | ORAL | 0 refills | Status: DC | PRN

## 2016-06-19 NOTE — Progress Notes (Signed)
Orthopedic Tech Progress Note Patient Details:  [name] 1960-08-18 [id number]  Ortho Devices Ortho Device/Splint Location: Assisted pt with Arm Sling.  Provided Training on Care and usage.  Pt Satisfied. (left arm)   Alvina ChouWilliams, Cortez Flippen C 06/19/2016, 11:59 AM

## 2016-06-19 NOTE — Progress Notes (Signed)
Pt stable for d/c home today per MD. Pt ambulates independently with sling. Pain controlled on PO oxy. Prescriptions and discharge instructions reviewed with pt, all questions answered. Waiting for neighbor to pick up and transport home.   [place], Latricia HeftKorie G

## 2016-06-19 NOTE — Progress Notes (Signed)
   Subjective:  Patient reports pain as mild.  Doing well this am.  Up and dressed, ready to go home.  Objective:   VITALS:   Vitals:   06/18/16 1635 06/18/16 2040 06/19/16 0017 06/19/16 0536  BP: (!) 151/73 139/66 131/73 (!) 138/50  Pulse: 83 91 94 84  Resp: (!) 21 17 16 18   Temp:  98.9 F (37.2 C) 98 F (36.7 C) 98.1 F (36.7 C)  TempSrc:  Oral Oral Oral  SpO2: 96% 100% 98% 93%  Weight:      Height:        Neurologically intact Neurovascular intact Intact pulses distally Incision: dressing C/D/I and no drainage intact ax/med/rad/uln/ain/pin   Lab Results  Component Value Date   WBC 14.4 (H) 06/19/2016   HGB 10.6 (L) 06/19/2016   HCT 33.5 (L) 06/19/2016   MCV 89.8 06/19/2016   PLT 312 06/19/2016   BMET    Component Value Date/Time   CREATININE 0.81 06/18/2016 1918   GFRNONAA >60 06/18/2016 1918   GFRAA >60 06/18/2016 1918     Assessment/Plan: 1 Day Post-Op   Active Problems:   Closed 4-part fracture of proximal humerus, left, initial encounter   -NWB in sling to left arm -elbow and wrist/hand motion encouraged and reviewed with patient -dc home today -follow up 2 weeks with Aundria Rudogers for wound check   Yolonda KidaJason Patrick Keiran Sias 06/19/2016, 9:44 AM   Maryan RuedJason P Devantae Babe, MD [phone]

## 2016-06-25 ENCOUNTER — Ambulatory Visit: Payer: Self-pay | Admitting: Physical Therapy

## 2016-06-25 ENCOUNTER — Ambulatory Visit: Payer: Self-pay | Admitting: Occupational Therapy

## 2016-06-26 NOTE — Discharge Summary (Signed)
Patient ID: Stephanie Hensley MRN: [id number] DOB/AGE: February 24, 1961 56 y.o.  Admit date: 06/18/2016 Discharge date: 06/19/2016  Primary Diagnosis:  1 . Left four-part proximal humerus fracture  Admission Diagnoses:  Past Medical History:  Diagnosis Date  . Asthma   . Headache    migraines in the past  . PTSD (post-traumatic stress disorder)    Discharge Diagnoses:   Active Problems:   Closed 4-part fracture of proximal humerus, left, initial encounter  Estimated body mass index is 47.26 kg/m as calculated from the following:   Height as of this encounter: '5\' 5"'  (1.651 m).   Weight as of this encounter: 128.8 kg (284 lb).  Procedure:  Procedure(s) (LRB): LEFT SHOULDER HEMI-ARTHROPLASTY (Left)   Consults: None  HPI: Stephanie Hensley is a 89 y.o.-year-old female with a left 4 part proximal humerus fracture with head splitting component based on CT scan;  The patient did consent to the procedure after discussion of the risks and benefits.  We discussed specifically the hemiarthroplasty option because of the high risk of humeral head AVN and the unique risk of tuberosity nonunion with the hemiarthroplasty option and need for further surgery in her future, but hopefully not in the near future.  Laboratory Data: Admission on 06/18/2016, Discharged on 06/19/2016  Component Date Value Ref Range Status  . WBC 06/18/2016 7.6  4.0 - 10.5 K/uL Final  . RBC 06/18/2016 4.12  3.87 - 5.11 MIL/uL Final  . Hemoglobin 06/18/2016 12.0  12.0 - 15.0 g/dL Final  . HCT 06/18/2016 36.9  36.0 - 46.0 % Final  . MCV 06/18/2016 89.6  78.0 - 100.0 fL Final  . MCH 06/18/2016 29.1  26.0 - 34.0 pg Final  . MCHC 06/18/2016 32.5  30.0 - 36.0 g/dL Final  . RDW 06/18/2016 13.1  11.5 - 15.5 % Final  . Platelets 06/18/2016 286  150 - 400 K/uL Final  . WBC 06/18/2016 11.7* 4.0 - 10.5 K/uL Final  . RBC 06/18/2016 3.86* 3.87 - 5.11 MIL/uL Final  . Hemoglobin 06/18/2016 11.1* 12.0 - 15.0 g/dL Final  . HCT 06/18/2016 34.9* 36.0  - 46.0 % Final  . MCV 06/18/2016 90.4  78.0 - 100.0 fL Final  . MCH 06/18/2016 28.8  26.0 - 34.0 pg Final  . MCHC 06/18/2016 31.8  30.0 - 36.0 g/dL Final  . RDW 06/18/2016 13.3  11.5 - 15.5 % Final  . Platelets 06/18/2016 250  150 - 400 K/uL Final  . Creatinine, Ser 06/18/2016 0.81  0.44 - 1.00 mg/dL Final  . GFR calc non Af Amer 06/18/2016 >60  >60 mL/min Final  . GFR calc Af Amer 06/18/2016 >60  >60 mL/min Final   Comment: (NOTE) The eGFR has been calculated using the CKD EPI equation. This calculation has not been validated in all clinical situations. eGFR's persistently <60 mL/min signify possible Chronic Kidney Disease.   . WBC 06/19/2016 14.4* 4.0 - 10.5 K/uL Final  . RBC 06/19/2016 3.73* 3.87 - 5.11 MIL/uL Final  . Hemoglobin 06/19/2016 10.6* 12.0 - 15.0 g/dL Final  . HCT 06/19/2016 33.5* 36.0 - 46.0 % Final  . MCV 06/19/2016 89.8  78.0 - 100.0 fL Final  . MCH 06/19/2016 28.4  26.0 - 34.0 pg Final  . MCHC 06/19/2016 31.6  30.0 - 36.0 g/dL Final  . RDW 06/19/2016 13.1  11.5 - 15.5 % Final  . Platelets 06/19/2016 312  150 - 400 K/uL Final  . Glucose-Capillary 06/19/2016 113* 65 - 99 mg/dL Final     X-Rays:Dg  Shoulder Left  Result Date: 06/01/2016 CLINICAL DATA:  Recent fall with left shoulder pain, initial encounter EXAM: LEFT SHOULDER - 2+ VIEW COMPARISON:  None. FINDINGS: There is comminuted fracture of the left proximal humerus. It appears to involve the greater tuberosity as well as the anatomic neck. No dislocation of the humeral head from the glenoid is noted. No other focal abnormality is seen. IMPRESSION: Comminuted proximal left humeral fracture. Electronically Signed   By: Inez Catalina M.D.   On: 06/01/2016 20:14   Dg Shoulder Left Port  Result Date: 06/18/2016 CLINICAL DATA:  Status post left shoulder hemiarthroplasty for fracture. EXAM: LEFT SHOULDER - 1 VIEW COMPARISON:  06/01/2016 FINDINGS: Portable single frontal view of the left shoulder obtained at 1620 hours  shows the patient be status post joint replacement. Residual fragment adjacent to the proximal prosthesis presumably related to the greater tuberosity fracture fragment seen on previous study. No evidence for immediate hardware complications. IMPRESSION: Status post left shoulder hemiarthroplasty without immediate complicating features. Electronically Signed   By: Misty Stanley M.D.   On: 06/18/2016 16:27    EKG:No orders found for this or any previous visit.   Hospital Course: Stephanie Hensley is a 73 y.o. who was admitted to Hospital. They were brought to the operating room on 06/18/2016 and underwent Procedure(s): LEFT SHOULDER HEMI-ARTHROPLASTY.  Patient tolerated the procedure well and was later transferred to the recovery room and then to the orthopaedic floor for postoperative care.  They were given PO and IV analgesics for pain control following their surgery.  They were given 24 hours of postoperative antibiotics of  Anti-infectives    Start     Dose/Rate Route Frequency Ordered Stop   06/18/16 1215  ceFAZolin (ANCEF) 3 g in dextrose 5 % 50 mL IVPB     3 g 130 mL/hr over 30 Minutes Intravenous To Short Stay 06/18/16 0930 06/18/16 1242     and started on DVT prophylaxis in the form of Aspirin.   PT and OT were ordered.  Discharge planning consulted to help with postop disposition and equipment needs.  Patient had a good night on the evening of surgery.  Patient was seen in rounds and was ready to go home.   Diet: Regular diet Activity:NWB Follow-up:in 2 weeks Disposition - Home Discharged Condition: good   Discharge Instructions    Call MD / Call 911    Complete by:  As directed    If you experience chest pain or shortness of breath, CALL 911 and be transported to the hospital emergency room.  If you develope a fever above 101 F, pus (white drainage) or increased drainage or redness at the wound, or calf pain, call your surgeon's office.   Constipation Prevention    Complete by:  As  directed    Drink plenty of fluids.  Prune juice may be helpful.  You may use a stool softener, such as Colace (over the counter) 100 mg twice a day.  Use MiraLax (over the counter) for constipation as needed.   Diet - low sodium heart healthy    Complete by:  As directed    Discharge instructions    Complete by:  As directed    - keep dressing in place until follow up -ok to shower but do not submerge under water -no weight bearing to the left arm -maintain sling at all times unless doing elbow and wrist ROM exercise -take one 325 mg aspirin daily for 4 weeks to prevent blood clots -  follow up in 2 weeks for wound check   Increase activity slowly as tolerated    Complete by:  As directed      Allergies as of 06/19/2016   No Known Allergies     Medication List    TAKE these medications   albuterol 108 (90 Base) MCG/ACT inhaler Commonly known as:  PROVENTIL HFA;VENTOLIN HFA Inhale 1-2 puffs into the lungs every 6 (six) hours as needed for wheezing or shortness of breath.   BENADRYL 25 MG tablet Generic drug:  diphenhydrAMINE Take 25 mg by mouth every 6 (six) hours as needed. Pt taking 1 a day for itching caused by Hydrocodone   cetirizine 10 MG tablet Commonly known as:  ZYRTEC Take 10 mg by mouth daily.   cyclobenzaprine 10 MG tablet Commonly known as:  FLEXERIL Take 0.5-1 tablets (5-10 mg total) by mouth 2 (two) times daily as needed for muscle spasms.   HYDROcodone-acetaminophen 5-325 MG tablet Commonly known as:  NORCO/VICODIN Take 2 tablets by mouth every 4 (four) hours as needed. What changed:  reasons to take this Notes to patient:  Do NOT take while taking oxycodone   ondansetron 4 MG tablet Commonly known as:  ZOFRAN Take 1 tablet (4 mg total) by mouth every 6 (six) hours.   oxyCODONE 5 MG immediate release tablet Commonly known as:  Oxy IR/ROXICODONE Take 1-2 tablets (5-10 mg total) by mouth every 4 (four) hours as needed for severe pain or breakthrough  pain.      Follow-up Information    Nicholes Stairs, MD. Schedule an appointment as soon as possible for a visit in 2 week(s).   Specialty:  Orthopedic Surgery Contact information: [address] [place] [address] [phone]           Signed: Geralynn Rile, MD Orthopaedic Surgery 06/26/2016, 5:59 PM

## 2016-07-16 ENCOUNTER — Ambulatory Visit: Payer: Self-pay | Admitting: Physical Therapy

## 2016-07-20 ENCOUNTER — Encounter: Payer: Self-pay | Admitting: Physical Therapy

## 2016-08-03 ENCOUNTER — Ambulatory Visit: Payer: Self-pay | Attending: Orthopedic Surgery | Admitting: Physical Therapy

## 2016-08-03 DIAGNOSIS — M25612 Stiffness of left shoulder, not elsewhere classified: Secondary | ICD-10-CM | POA: Insufficient documentation

## 2016-08-03 DIAGNOSIS — M25512 Pain in left shoulder: Secondary | ICD-10-CM | POA: Insufficient documentation

## 2016-08-03 DIAGNOSIS — M6281 Muscle weakness (generalized): Secondary | ICD-10-CM | POA: Insufficient documentation

## 2016-08-03 NOTE — Patient Instructions (Signed)
Cane Overhead - Supine  Hold cane at thighs with both hands, extend arms straight over head. Hold __10_ seconds. Repeat __5-10_ times. Do __2_ times per day.  Walk Up Exercise (Active/Assistive)    With elbow straight, use fingers to "crawl" up wall or door frame as far as possible. Hold __10__ seconds. Repeat __5__ times. Do __2__ sessions per day. Try this in the shower.   Copyright  VHI. All rights reserved.    Table slides see note  Pendulums all directions   Scapular Retraction (Standing)    With arms at sides, pinch shoulder blades together. Repeat _10___ times per set. Do _1-2___ sets per session. Do ___2_ sessions per day.  http://orth.exer.us/945   Copyright  VHI. All rights reserved.

## 2016-08-03 NOTE — Therapy (Signed)
[facility] [address] [place], [place], [address] Phone: [phone]   Fax:  [phone]  Physical Therapy Evaluation  Patient Details  Name: Stephanie Hensley MRN: [id number] Date of Birth: 1960/10/10 Referring Provider: Dr. Duwayne Heck   Encounter Date: 08/03/2016      PT End of Session - 08/03/16 2219    Visit Number 1   Number of Visits 16   Date for PT Re-Evaluation 09/28/16   PT Start Time 1107   PT Stop Time 1150   PT Time Calculation (min) 43 min   Activity Tolerance Patient limited by pain   Behavior During Therapy [facility] for tasks assessed/performed      Past Medical History:  Diagnosis Date  . Asthma   . Headache    migraines in the past  . PTSD (post-traumatic stress disorder)     Past Surgical History:  Procedure Laterality Date  . CHOLECYSTECTOMY    . HERNIA REPAIR     umbilical hernia repair  . KNEE SURGERY    . SHOULDER HEMI-ARTHROPLASTY Left 06/18/2016   Procedure: LEFT SHOULDER HEMI-ARTHROPLASTY;  Surgeon: Yolonda Kida, MD;  Location: [facility] OR;  Service: Orthopedics;  Laterality: Left;  . TUBAL LIGATION      There were no vitals filed for this visit.       Subjective Assessment - 08/03/16 1115    Subjective Pt fell on her L arm while trying to restrain her dog.  She suffered a fracture of her L UE (complex comminutedprox. humeral fracture) and 2 weeks later, underwent surgery to repair.  She is > 6weeks post surgery.  She is unable to effectively work, do ADLs and use her L dominant hand for mobility tasks.  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  She has gradually increased the amount she has used her L UE, wears the sling when she is out and as needed for pain.    Limitations Lifting;Writing;House hold activities;Other (comment)  ADLs   Diagnostic tests XR   Patient Stated Goals improve function of her arm, reduce pain   Currently in Pain? Yes   Pain Score 1     Pain Location Shoulder   Pain Orientation Left;Anterior   Pain Descriptors / Indicators Discomfort   Pain Type Surgical pain   Pain Radiating Towards sometimes in hand and fingers    Pain Onset More than a month ago   Pain Frequency Intermittent   Aggravating Factors  using L arm    Pain Relieving Factors sling (is discharged), pain meds, does not use ice    Effect of Pain on Daily Activities her dominant arm, work and safety is impacted.             [facility] PT Assessment - 08/03/16 1133      Assessment   Medical Diagnosis L hemiarthroplasty of L shoulder    Referring Provider Dr. Missy Sabins    Onset Date/Surgical Date 06/18/16   Hand Dominance Left   Next MD Visit 4 weeks    Prior Therapy Yes, for knee      Precautions   Precautions None   Precaution Comments shoulder, no sudden movements, lifting    Required Braces or Orthoses Sling  as needed     Restrictions   Weight Bearing Restrictions No     Balance Screen   Has the patient fallen in the past 6 months Yes   How many times? 1  Has the patient had a decrease in activity level because of a fear of falling?  Yes  due to surgery,not balance issues    Is the patient reluctant to leave their home because of a fear of falling?  No     Home Environment   Living Environment Private residence   Living Arrangements Alone   Type of Home House     Prior Function   Level of Independence Independent     Cognition   Overall Cognitive Status Within Functional Limits for tasks assessed     Observation/Other Assessments-Edema    Edema --  not measured but visible to patient and PT in biceps L      Sensation   Light Touch Appears Intact     Coordination   Gross Motor Movements are Fluid and Coordinated Not tested     Posture/Postural Control   Posture/Postural Control Postural limitations   Postural Limitations Rounded Shoulders;Forward head   Posture Comments scapular abd from midline and shoulders IR       AROM   Left Shoulder Flexion 30 Degrees  shoulder hike    Left Shoulder ABduction --  unable    Left Shoulder External Rotation 20 Degrees     PROM   Right/Left Shoulder --  supine at slight ABD    Left Shoulder Flexion 80 Degrees   Left Shoulder ABduction 80 Degrees   Left Shoulder Internal Rotation 50 Degrees   Left Shoulder External Rotation 30 Degrees     Strength   Strength Assessment Site --  NT due to surgery in flexion, abd    Left Shoulder Internal Rotation 3+/5   Left Shoulder External Rotation 3-/5     Palpation   Palpation comment tender and sore along incision and ant/lat deltoid                   [facility] Adult PT Treatment/Exercise - 08/03/16 1133      Self-Care   Self-Care Scar Mobilizations   ADL's within a protocol (AROM)    Scar Mobilizations brief   Heat/Ice Application ice for post ex, declined today   Other Self-Care Comments  HEP and financial      Shoulder Exercises: Seated   Retraction Strengthening;Both;5 reps   Abduction AAROM;Left;20 reps   ABduction Weight (lbs) UE ranger      Shoulder Exercises: Pulleys   Flexion 2 minutes   Flexion Limitations unable without shoulder hike    ABduction 1 minute   ABduction Limitations hike     Shoulder Exercises: ROM/Strengthening   Pendulum given, has been doing                 PT Education - 08/03/16 2218    Education provided Yes   Education Details PT/POC, self care, HEP and AROM    Person(s) Educated Patient   Methods Explanation;Demonstration;Tactile cues;Verbal cues;Handout   Comprehension Verbalized understanding;Need further instruction;Returned demonstration          PT Short Term Goals - 08/03/16 2225      PT SHORT TERM GOAL #1   Title Pt will be I with initial HEP for AROM and strength of L UE    Time 2   Period Weeks   Status New     PT SHORT TERM GOAL #2   Title Pt will be able to lift L UE to 80 deg with min scapular elevation using visual feedback.     Time 4   Period Weeks  Status New     PT SHORT TERM GOAL #3   Title Pt will have AROM of L UE to 120 deg in flexion and scaption in supine    Time 4   Period Weeks   Status New     PT SHORT TERM GOAL #4   Title Pt will continue to use her L UE for ADLs with assist of Rt UE and use caution at work, wean out of sling.    Time 4   Period Weeks   Status New           PT Long Term Goals - 08/03/16 2232      PT LONG TERM GOAL #1   Title Pt will be able to lift L UE to 100 deg flexion with good scapular control for functional reach    Time 8   Period Weeks   Status New     PT LONG TERM GOAL #2   Title Pt will be able to reach behind her head, back with min pain and min difficulty for ADLs.    Time 8   Period Weeks   Status New     PT LONG TERM GOAL #3   Title Pt will demo strength in L UE to 4/5 all planes to maintain stability and balance of shoulder joint.    Time 8   Period Weeks   Status New     PT LONG TERM GOAL #4   Title Pt will be I with more advanced HEP for L shoulder ROM, strength    Time 8   Period Weeks   Status New     PT LONG TERM GOAL #5   Title FOTO score will improve to by 20% to demo functional gains    Time 8   Period Weeks   Status New               Plan - 08/03/16 2220    Clinical Impression Statement Pt with low complexity eval of L shoulder hemiarthroplasty following a traumatic fall on her L shoulder.  Surgery was 06/18/16, has not started PT until today, 6 weeks out.  She has significant limitations in AROM, strength, ability to use her LT without abnormal movement patterns.  Her L UE is her dominant hand.  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  She currently does not have insurance but is applying for [facility] FA.  She was given HEP and is motivated to improve fully.      Rehab Potential Good  late start to PT    PT Frequency 2x / week   PT Duration 8 weeks   PT Treatment/Interventions ADLs/Self Care Home  Management;Therapeutic activities;Therapeutic exercise;Moist Heat;Cryotherapy;Electrical Stimulation;Functional mobility training;Patient/family education;Passive range of motion;Scar mobilization;Neuromuscular re-education;Manual techniques;Taping;Manual lymph drainage   PT Next Visit Plan check HEP, manage swelling (manual, vaso?) and AAROM/isometric to tol .  Use UE Ranger in sidelying or sitting.    PT Home Exercise Plan cont pendulums, scap retraction, wall and table slides   Consulted and Agree with Plan of Care Patient      Patient will benefit from skilled therapeutic intervention in order to improve the following deficits and impairments:  Decreased scar mobility, Postural dysfunction, Increased edema, Decreased strength, Decreased mobility, Pain, Impaired UE functional use, Obesity, Increased fascial restricitons, Decreased range of motion  Visit Diagnosis: Stiffness of left shoulder, not elsewhere classified  Muscle weakness (generalized)  Acute pain of left shoulder  Problem List Patient Active Problem List   Diagnosis Date Noted  . Closed 4-part fracture of proximal humerus, left, initial encounter 06/18/2016    PAA,JENNIFER 08/03/2016, 10:38 PM  [facility] [address] [place], [place], [address] Phone: [phone]   Fax:  [phone]  Name: Stephanie Hensley MRN: [id number] Date of Birth: 1960-07-20   [name], PT 08/03/16 10:38 PM Phone: [phone] Fax: [phone]

## 2016-08-10 ENCOUNTER — Ambulatory Visit: Payer: Self-pay | Attending: Orthopedic Surgery | Admitting: Physical Therapy

## 2016-08-10 DIAGNOSIS — M6281 Muscle weakness (generalized): Secondary | ICD-10-CM | POA: Insufficient documentation

## 2016-08-10 DIAGNOSIS — M25512 Pain in left shoulder: Secondary | ICD-10-CM | POA: Insufficient documentation

## 2016-08-10 DIAGNOSIS — M25612 Stiffness of left shoulder, not elsewhere classified: Secondary | ICD-10-CM | POA: Insufficient documentation

## 2016-08-10 NOTE — Therapy (Signed)
[facility] [address] [place], [place], [address] Phone: [phone]   Fax:  [phone]  Physical Therapy Treatment  Patient Details  Name: Stephanie LankKimberly Hensley MRN: [id number] Date of Birth: 07/15/1960 Referring Provider: Dr. Missy SabinsJason Rodgers   Encounter Date: 08/10/2016      PT End of Session - 08/10/16 1312    Visit Number 2   Number of Visits 16   Date for PT Re-Evaluation 09/28/16   PT Start Time 1140   PT Stop Time 1239   PT Time Calculation (min) 59 min   Activity Tolerance Patient tolerated treatment well   Behavior [facility] tasks assessed/performed      Past Medical History:  Diagnosis Date  . Asthma   . Headache    migraines in the past  . PTSD (post-traumatic stress disorder)     Past Surgical History:  Procedure Laterality Date  . CHOLECYSTECTOMY    . HERNIA REPAIR     umbilical hernia repair  . KNEE SURGERY    . SHOULDER HEMI-ARTHROPLASTY Left 06/18/2016   Procedure: LEFT SHOULDER HEMI-ARTHROPLASTY;  Surgeon: Yolonda KidaJason Patrick Rogers, MD;  [facility];  Service: Orthopedics;  Laterality: Left;  . TUBAL LIGATION      There were no vitals filed for this visit.      Subjective Assessment - 08/10/16 1144    Subjective Stiffness - 3/10  Able to sleep in right side if I prop arm up on pillows.   No able to use left hand at the [place] at [place].  Sleeping a ;little better.    Pain Score 3    Pain Location Shoulder   Pain Orientation Left;Anterior   Pain Descriptors / Indicators --  stiff   Pain Type Surgical pain   Pain Frequency Intermittent   Aggravating Factors  using left arm   Pain Relieving Factors pain meds at night   Effect of Pain on Daily Activities using her arm.  No lifting            [facility] PT Assessment - 08/10/16 0001      AROM   Left Shoulder Flexion --  unable without compensation     PROM   Left Shoulder Flexion 110 Degrees  supine cane                      [facility] Adult PT Treatment/Exercise - 08/10/16 0001      Shoulder Exercises: Supine   Horizontal ABduction Limitations HEP,  Cane 10 X, cues,  small motions   Internal Rotation 10 reps   Internal Rotation Limitations Small movements,  IR/ER cane,   HEP  10 x   Flexion 10 reps   Flexion Limitations cane,  HEP,  also chest press 10 x,  HEP     Shoulder Exercises: Standing   Flexion 5 reps   Flexion Limitations poor form so stopped     Shoulder Exercises: Pulleys   Flexion 3 minutes  hikes   ABduction 3 minutes   ABduction Limitations hikes     Shoulder Exercises: ROM/Strengthening   Other ROM/Strengthening Exercises UE Ranger,  AA  sitting and standing,  Multiple cues fo compensation     Cryotherapy   Number Minutes Cryotherapy 10 Minutes   Cryotherapy Location Shoulder   Type of Cryotherapy --  cold pack     Manual Therapy   Manual therapy comments retrograde,  lymphnode activation,  soft tissue work PROM left  PT Education - 08/10/16 1311    Education provided Yes   Education Details HEP   Person(s) Educated Patient   Methods Explanation;Demonstration;Verbal cues;Handout;Tactile cues   Comprehension Verbalized understanding;Returned demonstration          PT Short Term Goals - 08/10/16 1315      PT SHORT TERM GOAL #1   Title Pt will be I with initial HEP for AROM and strength of L UE    Baseline cues   Time 2   Period Weeks   Status On-going     PT SHORT TERM GOAL #2   Title Pt will be able to lift L UE to 80 deg with min scapular elevation using visual feedback.    Time 4   Period Weeks   Status On-going     PT SHORT TERM GOAL #3   Title Pt will have AROM of L UE to 120 deg in flexion and scaption in supine    Baseline 110,  AA flexion   Time 4   Period Weeks   Status On-going     PT SHORT TERM GOAL #4   Title Pt will continue to use her L UE for ADLs with assist of Rt UE and use caution at work,  wean out of sling.    Baseline able to do more with left with assist and compensation   Time 4   Period Weeks   Status On-going           PT Long Term Goals - 08/03/16 2232      PT LONG TERM GOAL #1   Title Pt will be able to lift L UE to 100 deg flexion with good scapular control for functional reach    Time 8   Period Weeks   Status New     PT LONG TERM GOAL #2   Title Pt will be able to reach behind her head, back with min pain and min difficulty for ADLs.    Time 8   Period Weeks   Status New     PT LONG TERM GOAL #3   Title Pt will demo strength in L UE to 4/5 all planes to maintain stability and balance of shoulder joint.    Time 8   Period Weeks   Status New     PT LONG TERM GOAL #4   Title Pt will be I with more advanced HEP for L shoulder ROM, strength    Time 8   Period Weeks   Status New     PT LONG TERM GOAL #5   Title FOTO score will improve to by 20% to demo functional gains    Time 8   Period Weeks   Status New               Plan - 08/10/16 1313    Clinical Impression Statement Supine 110 cane flexion.  Progress toward Home exercise goals.  AROM continues to be significant.  pain 4-5/10 post exercise, manual.    PT Next Visit Plan check HEP, manage swelling (manual, vaso?) and AAROM/isometric to tol .  Use UE Ranger in sidelying or sitting.    PT Home Exercise Plan cont pendulums, scap retraction, wall and table slides,  supine cane   Consulted and Agree with Plan of Care Patient      Patient will benefit from skilled therapeutic intervention in order to improve the following deficits and impairments:  Decreased scar mobility, Postural dysfunction, Increased edema, Decreased  strength, Decreased mobility, Pain, Impaired UE functional use, Obesity, Increased fascial restricitons, Decreased range of motion  Visit Diagnosis: Stiffness of left shoulder, not elsewhere classified  Muscle weakness (generalized)  Acute pain of left  shoulder     Problem List Patient Active Problem List   Diagnosis Date Noted  . Closed 4-part fracture of proximal humerus, left, initial encounter 06/18/2016    HARRIS,KAREN PTA 08/10/2016, 1:17 PM  [facility] [address] [place], [place], [address] Phone: [phone]   Fax:  [phone]  Name: Stephanie Hensley MRN: [id number] Date of Birth: [birth year]

## 2016-08-10 NOTE — Patient Instructions (Signed)
Supine cane issued from ex drawer.  1-2 x a day 5-10 x each All issued painfree

## 2016-08-17 ENCOUNTER — Ambulatory Visit: Payer: Self-pay | Admitting: Physical Therapy

## 2016-08-19 ENCOUNTER — Ambulatory Visit: Payer: Self-pay | Admitting: Physical Therapy

## 2016-08-19 ENCOUNTER — Encounter: Payer: Self-pay | Admitting: Physical Therapy

## 2016-08-19 DIAGNOSIS — M25512 Pain in left shoulder: Secondary | ICD-10-CM

## 2016-08-19 DIAGNOSIS — M6281 Muscle weakness (generalized): Secondary | ICD-10-CM

## 2016-08-19 DIAGNOSIS — M25612 Stiffness of left shoulder, not elsewhere classified: Secondary | ICD-10-CM

## 2016-08-24 ENCOUNTER — Ambulatory Visit: Payer: Self-pay | Admitting: Physical Therapy

## 2016-08-24 DIAGNOSIS — M6281 Muscle weakness (generalized): Secondary | ICD-10-CM

## 2016-08-24 DIAGNOSIS — M25512 Pain in left shoulder: Secondary | ICD-10-CM

## 2016-08-24 DIAGNOSIS — M25612 Stiffness of left shoulder, not elsewhere classified: Secondary | ICD-10-CM

## 2016-08-24 NOTE — Patient Instructions (Signed)
Strengthening: Isometric Flexion  Using wall for resistance, press right fist into ball using light pressure. Hold __5__ seconds. Repeat _10___ times per set. Do _1___ sets per session. Do ___1-2_ sessions per day.  SHOULDER: Abduction (Isometric)  Use wall as resistance. Press arm against pillow. Keep elbow straight. Hold __5_ seconds. _10__ reps per set, _1-2__ sets per day, __5-7_ days per week  Extension (Isometric)  Place left bent elbow and back of arm against wall. Press elbow against wall. Hold _5___ seconds. Repeat ___10_ times. Do __1-2__ sessions per day.  Internal Rotation (Isometric)  Place palm of right fist against door frame, with elbow bent. Press fist against door frame. Hold __5__ seconds. Repeat __10__ times. Do ___1-2_ sessions per day.  External Rotation (Isometric)  Place back of left fist against door frame, with elbow bent. Press fist against door frame. Hold __5__ seconds. Repeat ___10_ times. Do __1-2__ sessions per day.  Copyright  VHI. All rights reserved.

## 2016-08-24 NOTE — Therapy (Signed)
[facility] [address] [place], [place], [address] Phone: [phone]   Fax:  [phone]  Physical Therapy Treatment  Patient Details  Name: Arlee Bossard MRN: [id number] Date of Birth: 1961/01/07 Referring Provider: Dr. Missy Sabins   Encounter Date: 08/24/2016      PT End of Session - 08/24/16 1045    Visit Number 4   Number of Visits 16   Date for PT Re-Evaluation 09/28/16   PT Start Time 1005   PT Stop Time 1105   PT Time Calculation (min) 60 min   Activity Tolerance Patient tolerated treatment well   Behavior During Therapy [facility] for tasks assessed/performed      Past Medical History:  Diagnosis Date  . Asthma   . Headache    migraines in the past  . PTSD (post-traumatic stress disorder)     Past Surgical History:  Procedure Laterality Date  . CHOLECYSTECTOMY    . HERNIA REPAIR     umbilical hernia repair  . KNEE SURGERY    . SHOULDER HEMI-ARTHROPLASTY Left 06/18/2016   Procedure: LEFT SHOULDER HEMI-ARTHROPLASTY;  Surgeon: Yolonda Kida, MD;  Location: [facility] OR;  Service: Orthopedics;  Laterality: Left;  . TUBAL LIGATION      There were no vitals filed for this visit.          [facility] PT Assessment - 08/24/16 1010      AROM   Left Shoulder Flexion 50 Degrees  supine 115 deg    Left Shoulder ABduction 60 Degrees   Left Shoulder Internal Rotation --  70 deg at 45 deg abd    Left Shoulder External Rotation --  38 with shoulder abd 45 deg      PROM   Left Shoulder ABduction --                     [facility] Adult PT Treatment/Exercise - 08/24/16 1025      Self-Care   ADL's progress, reaching , strength vs stretching    Heat/Ice Application ice    Other Self-Care Comments  posture, and scapular position     Shoulder Exercises: ROM/Strengthening   Other ROM/Strengthening Exercises UE Ranger supine cane: chest press, overhead and ER to tolerance    Other ROM/Strengthening Exercises  UE Ranger seated, focus on scapular      Shoulder Exercises: Isometric Strengthening   Flexion 5X10"   Extension 5X10"   External Rotation 5X10"   Internal Rotation 5X10"   ABduction 5X10"   ADduction 5X10"     Cryotherapy   Number Minutes Cryotherapy 10 Minutes   Cryotherapy Location Shoulder   Type of Cryotherapy Ice pack                PT Education - 08/24/16 1044    Education provided Yes   Education Details isometrics, progress   Person(s) Educated Patient   Methods Explanation;Handout;Demonstration;Tactile cues;Verbal cues   Comprehension Verbalized understanding;Returned demonstration;Tactile cues required;Verbal cues required          PT Short Term Goals - 08/24/16 1058      PT SHORT TERM GOAL #1   Title Pt will be I with initial HEP for AROM and strength of L UE    Status On-going     PT SHORT TERM GOAL #2   Title Pt will be able to lift L UE to 80 deg with min scapular elevation using visual feedback.    Status On-going     PT  SHORT TERM GOAL #3   Title Pt will have AROM of L UE to 120 deg in flexion and scaption in supine    Status On-going     PT SHORT TERM GOAL #4   Title Pt will continue to use her L UE for ADLs with assist of Rt UE and use caution at work, wean out of sling.    Status On-going           PT Long Term Goals - 08/24/16 1059      PT LONG TERM GOAL #1   Title Pt will be able to lift L UE to 100 deg flexion with good scapular control for functional reach    Status On-going     PT LONG TERM GOAL #2   Title Pt will be able to reach behind her head, back with min pain and min difficulty for ADLs.    Status On-going     PT LONG TERM GOAL #3   Title Pt will demo strength in L UE to 4/5 all planes to maintain stability and balance of shoulder joint.    Status On-going     PT LONG TERM GOAL #4   Title Pt will be I with more advanced HEP for L shoulder ROM, strength    Status On-going     PT LONG TERM GOAL #5   Title FOTO  score will improve to by 20% to demo functional gains    Status On-going               Plan - 08/24/16 1058    Clinical Impression Statement Progressing towards goals.  She has compensatory scapular elevation with functional movement.  AROM improved but unable to reach to shoulder height due to weakness.  She is working but does not have to lift with her LUE.  Awaiting financial assist.     PT Next Visit Plan AROM and AAROM/isometric to tol .  Use UE Ranger in sidelying or sitting.    PT Home Exercise Plan cont pendulums, scap retraction, wall and table slides,  supine cane and isometrics    Consulted and Agree with Plan of Care Patient      Patient will benefit from skilled therapeutic intervention in order to improve the following deficits and impairments:  Decreased scar mobility, Postural dysfunction, Increased edema, Decreased strength, Decreased mobility, Pain, Impaired UE functional use, Obesity, Increased fascial restricitons, Decreased range of motion  Visit Diagnosis: Stiffness of left shoulder, not elsewhere classified  Muscle weakness (generalized)  Acute pain of left shoulder     Problem List Patient Active Problem List   Diagnosis Date Noted  . Closed 4-part fracture of proximal humerus, left, initial encounter 06/18/2016    Miela Desjardin 08/24/2016, 11:18 AM  [facility] [address] [place], [place], [address] Phone: [phone]   Fax:  [phone]  Name: Adela LankKimberly Footman MRN: [id number] Date of Birth: 02-04-1961  Karie MainlandJennifer Dezhane Staten, PT 08/24/16 11:18 AM Phone: [phone] Fax: [phone]

## 2016-08-26 ENCOUNTER — Encounter: Payer: Self-pay | Admitting: Physical Therapy

## 2016-08-26 ENCOUNTER — Ambulatory Visit: Payer: Self-pay | Admitting: Physical Therapy

## 2016-08-26 DIAGNOSIS — M25512 Pain in left shoulder: Secondary | ICD-10-CM

## 2016-08-26 DIAGNOSIS — M25612 Stiffness of left shoulder, not elsewhere classified: Secondary | ICD-10-CM

## 2016-08-26 DIAGNOSIS — M6281 Muscle weakness (generalized): Secondary | ICD-10-CM

## 2016-08-26 NOTE — Patient Instructions (Signed)
Scapular depression with red band tied high in door, Daily  10 x

## 2016-08-26 NOTE — Therapy (Signed)
[facility] [address] [place], [place], [address] Phone: [phone]   Fax:  [phone]  Physical Therapy Treatment  Patient Details  Name: Stephanie LankKimberly Hensley MRN: [id number] Date of Birth: May 23, 1961 Referring Provider: Dr. Missy SabinsJason Rodgers   Encounter Date: 08/19/2016      PT End of Session - 08/26/16 1310    Visit Number 3   Number of Visits 16   Date for PT Re-Evaluation 09/28/16   PT Start Time 1017   PT Stop Time 1117   PT Time Calculation (min) 58 min   Activity Tolerance Patient tolerated treatment well      Past Medical History:  Diagnosis Date  . Asthma   . Headache    migraines in the past  . PTSD (post-traumatic stress disorder)     Past Surgical History:  Procedure Laterality Date  . CHOLECYSTECTOMY    . HERNIA REPAIR     umbilical hernia repair  . KNEE SURGERY    . SHOULDER HEMI-ARTHROPLASTY Left 06/18/2016   Procedure: LEFT SHOULDER HEMI-ARTHROPLASTY;  Surgeon: Yolonda KidaJason Patrick Rogers, MD;  [facility];  Service: Orthopedics;  Laterality: Left;  . TUBAL LIGATION      There were no vitals filed for this visit.      Subjective Assessment - 08/26/16 1028    Subjective Focus on pain control and ROM.  Patient tolerates exercises with pain at end ranges.  Progress toward HEP goals.     Currently in Pain? Yes,  Anterior shoulder into arm off /on   Pain Score Moderate pain   Pain Location Shoulder   Pain Orientation Left   Aggravating Factors  Cold weather   Pain Relieving Factors Pain meds   Effect of Pain on Daily Activities not lifting   Multiple Pain Sites No     Mild pain post session.  Better with cold pack. Manual consisted of soft tissue work and passive stretches.  Exercises consisted of Light Isometrics and AAROM. No new objective findings. Next visit,  Isometrics, ROM Stephanie Hensley PTA                    [facility] Adult PT Treatment/Exercise - 08/26/16 0001      Shoulder  Exercises: Supine   Horizontal ABduction AAROM;5 reps  Unable to do actively yet,  emotional support given   External Rotation AAROM;5 reps   Flexion AAROM;10 reps   Other Supine Exercises cane press,  flexion, horizontal adduction/ Abduction, External rotation. 2 sets of 10,  1 set stretch and 1 set strengthening focus.  Heavy cues     Shoulder Exercises: Seated   Other Seated Exercises UE ranger 3 positions 10 X each cues shoulder down.     Shoulder Exercises: Standing   Other Standing Exercises Scapular depression, 10 x red Band HEP     Shoulder Exercises: Pulleys   Flexion --  5 minutes     Cryotherapy   Number Minutes Cryotherapy 10 Minutes   Cryotherapy Location Shoulder   Type of Cryotherapy --  cold pack                PT Education - 08/26/16 1310    Education provided Yes   Education Details How to progress exercises,  HEP   Person(s) Educated Patient   Methods Explanation   Comprehension Verbalized understanding;Returned demonstration          PT Short Term Goals - 08/24/16 1058      PT SHORT TERM  GOAL #1   Title Pt will be I with initial HEP for AROM and strength of L UE    Status On-going     PT SHORT TERM GOAL #2   Title Pt will be able to lift L UE to 80 deg with min scapular elevation using visual feedback.    Status On-going     PT SHORT TERM GOAL #3   Title Pt will have AROM of L UE to 120 deg in flexion and scaption in supine    Status On-going     PT SHORT TERM GOAL #4   Title Pt will continue to use her L UE for ADLs with assist of Rt UE and use caution at work, wean out of sling.    Status On-going           PT Long Term Goals - 08/24/16 1059      PT LONG TERM GOAL #1   Title Pt will be able to lift L UE to 100 deg flexion with good scapular control for functional reach    Status On-going     PT LONG TERM GOAL #2   Title Pt will be able to reach behind her head, back with min pain and min difficulty for ADLs.    Status  On-going     PT LONG TERM GOAL #3   Title Pt will demo strength in L UE to 4/5 all planes to maintain stability and balance of shoulder joint.    Status On-going     PT LONG TERM GOAL #4   Title Pt will be I with more advanced HEP for L shoulder ROM, strength    Status On-going     PT LONG TERM GOAL #5   Title FOTO score will improve to by 20% to demo functional gains    Status On-going               Plan - 08/26/16 1311    Clinical Impression Statement Patient has the OK for strengthening.  Supine AA is what she tolerates today.  Patient can reach from chest  to 90 in supine with CGA. (Left)  No pain increase post exercise,  my shoulder is "Numb"  (ie: fatigued?)   PT Next Visit Plan Pulleys,  UE ranger AA supine,  Isometrics Keep things positive.    PT Home Exercise Plan cont pendulums, scap retraction, wall and table slides,  supine cane and isometrics    Consulted and Agree with Plan of Care Patient      Patient will benefit from skilled therapeutic intervention in order to improve the following deficits and impairments:  Decreased scar mobility, Postural dysfunction, Increased edema, Decreased strength, Decreased mobility, Pain, Impaired UE functional use, Obesity, Increased fascial restricitons, Decreased range of motion  Visit Diagnosis: Stiffness of left shoulder, not elsewhere classified  Muscle weakness (generalized)  Acute pain of left shoulder     Problem List Patient Active Problem List   Diagnosis Date Noted  . Closed 4-part fracture of proximal humerus, left, initial encounter 06/18/2016    [facility] 08/26/2016, 1:27 PM  [facility] [address] [place], [place], [address] Phone: [phone]   Fax:  [phone]  Name: Stephanie Hensley MRN: [id number] Date of Birth: Jun 20, 1960

## 2016-08-26 NOTE — Therapy (Signed)
[facility] [address] [place], [place], [address] Phone: [phone]   Fax:  [phone]  Physical Therapy Treatment  Patient Details  Name: Stephanie LankKimberly Peel MRN: [id number] Date of Birth: 11-19-1960 Referring Provider: Dr. Missy SabinsJason Rodgers   Encounter Date: 08/26/2016      PT End of Session - 08/26/16 1310    Visit Number 5   Number of Visits 16   Date for PT Re-Evaluation 09/28/16   PT Start Time 1017   PT Stop Time 1115   PT Time Calculation (min) 58 min   Activity Tolerance Patient tolerated treatment well      Past Medical History:  Diagnosis Date  . Asthma   . Headache    migraines in the past  . PTSD (post-traumatic stress disorder)     Past Surgical History:  Procedure Laterality Date  . CHOLECYSTECTOMY    . HERNIA REPAIR     umbilical hernia repair  . KNEE SURGERY    . SHOULDER HEMI-ARTHROPLASTY Left 06/18/2016   Procedure: LEFT SHOULDER HEMI-ARTHROPLASTY;  Surgeon: Yolonda KidaJason Patrick Rogers, MD;  [facility];  Service: Orthopedics;  Laterality: Left;  . TUBAL LIGATION      There were no vitals filed for this visit.      Subjective Assessment - 08/26/16 1028    Subjective No pain. Saw MD  OK to begin AROM Streengthening.  Patient able to put hair up in band at the front of her head with compsensation and 5/10 pain,  for the first time.    Currently in Pain? No/denies   Pain Score 0-No pain   Pain Location Shoulder   Pain Orientation Left   Aggravating Factors  Cold weather   Pain Relieving Factors Pain meds   Effect of Pain on Daily Activities not lifting   Multiple Pain Sites No                         [facility] Adult PT Treatment/Exercise - 08/26/16 0001      Shoulder Exercises: Supine   Horizontal ABduction AAROM;5 reps  Unable to do actively yet,  emotional support given   External Rotation AAROM;5 reps   Flexion AAROM;10 reps   Other Supine Exercises cane press,  flexion,  horizontal adduction/ Abduction, External rotation. 2 sets of 10,  1 set stretch and 1 set strengthening focus.  Heavy cues     Shoulder Exercises: Seated   Other Seated Exercises UE ranger 3 positions 10 X each cues shoulder down.     Shoulder Exercises: Standing   Other Standing Exercises Scapular depression, 10 x red Band HEP     Shoulder Exercises: Pulleys   Flexion --  5 minutes     Cryotherapy   Number Minutes Cryotherapy 10 Minutes   Cryotherapy Location Shoulder   Type of Cryotherapy --  cold pack                PT Education - 08/26/16 1310    Education provided Yes   Education Details How to progress exercises,  HEP   Person(s) Educated Patient   Methods Explanation   Comprehension Verbalized understanding;Returned demonstration          PT Short Term Goals - 08/24/16 1058      PT SHORT TERM GOAL #1   Title Pt will be I with initial HEP for AROM and strength of L UE    Status On-going     PT SHORT  TERM GOAL #2   Title Pt will be able to lift L UE to 80 deg with min scapular elevation using visual feedback.    Status On-going     PT SHORT TERM GOAL #3   Title Pt will have AROM of L UE to 120 deg in flexion and scaption in supine    Status On-going     PT SHORT TERM GOAL #4   Title Pt will continue to use her L UE for ADLs with assist of Rt UE and use caution at work, wean out of sling.    Status On-going           PT Long Term Goals - 08/24/16 1059      PT LONG TERM GOAL #1   Title Pt will be able to lift L UE to 100 deg flexion with good scapular control for functional reach    Status On-going     PT LONG TERM GOAL #2   Title Pt will be able to reach behind her head, back with min pain and min difficulty for ADLs.    Status On-going     PT LONG TERM GOAL #3   Title Pt will demo strength in L UE to 4/5 all planes to maintain stability and balance of shoulder joint.    Status On-going     PT LONG TERM GOAL #4   Title Pt will be I  with more advanced HEP for L shoulder ROM, strength    Status On-going     PT LONG TERM GOAL #5   Title FOTO score will improve to by 20% to demo functional gains    Status On-going               Plan - 08/26/16 1311    Clinical Impression Statement Patient has the OK for strengthening.  Supine AA is what she tolerates today.  Patient can reach from chest  to 90 in supine with CGA. (Left)  No pain increase post exercise,  my shoulder is "Numb"  (ie: fatigued?)   PT Next Visit Plan Pulleys,  UE ranger AA supine,  Isometrics Keep things positive.    PT Home Exercise Plan cont pendulums, scap retraction, wall and table slides,  supine cane and isometrics    Consulted and Agree with Plan of Care Patient      Patient will benefit from skilled therapeutic intervention in order to improve the following deficits and impairments:  Decreased scar mobility, Postural dysfunction, Increased edema, Decreased strength, Decreased mobility, Pain, Impaired UE functional use, Obesity, Increased fascial restricitons, Decreased range of motion  Visit Diagnosis: Stiffness of left shoulder, not elsewhere classified  Muscle weakness (generalized)  Acute pain of left shoulder     Problem List Patient Active Problem List   Diagnosis Date Noted  . Closed 4-part fracture of proximal humerus, left, initial encounter 06/18/2016    Gabriel Paulding PTA 08/26/2016, 1:21 PM  [facility] [address] [place], [place], [address] Phone: [phone]   Fax:  [phone]  Name: Stephanie Hensley MRN: [id number] Date of Birth: May 30, 1961

## 2016-08-31 ENCOUNTER — Ambulatory Visit: Payer: Self-pay | Admitting: Physical Therapy

## 2016-08-31 DIAGNOSIS — M25512 Pain in left shoulder: Secondary | ICD-10-CM

## 2016-08-31 DIAGNOSIS — M25612 Stiffness of left shoulder, not elsewhere classified: Secondary | ICD-10-CM

## 2016-08-31 DIAGNOSIS — M6281 Muscle weakness (generalized): Secondary | ICD-10-CM

## 2016-08-31 NOTE — Therapy (Signed)
[facility] [address] [place], [place], [address] Phone: [phone]   Fax:  [phone]  [facility]  Patient Details  Name: Stephanie LankKimberly Loveridge MRN: [id number] Date of Birth: 10-10-1960 Referring Provider: Dr. Missy SabinsJason Rodgers   Encounter Date: 08/31/2016      PT End of Session - 08/31/16 1106    Visit Number 6   Number of Visits 16   Date for PT Re-Evaluation 09/28/16   PT Start Time 1016   PT Stop Time 1110   PT Time Calculation (min) 54 min   Activity Tolerance Patient tolerated treatment well   Behavior During Therapy [facility] for tasks assessed/performed      Past Medical History:  Diagnosis Date  . Asthma   . Headache    migraines in the past  . PTSD (post-traumatic stress disorder)     Past Surgical History:  Procedure Laterality Date  . CHOLECYSTECTOMY    . HERNIA REPAIR     umbilical hernia repair  . KNEE SURGERY    . SHOULDER HEMI-ARTHROPLASTY Left 06/18/2016   Procedure: LEFT SHOULDER HEMI-ARTHROPLASTY;  Surgeon: Yolonda KidaJason Patrick Rogers, MD;  [facility];  Service: Orthopedics;  Laterality: Left;  . TUBAL LIGATION      There were no vitals filed for this visit.      Subjective Assessment - 08/31/16 1020    Subjective Tired, having a tough time with allergies today.  Knee is bothering her.  Takes hydrocodone mostly for my knee.              [facility] Adult PT Treatment/Exercise - 08/31/16 1043      Shoulder Exercises: Supine   Horizontal ABduction AAROM;Both;10 reps   External Rotation AAROM;Both;10 reps   Flexion AAROM;Strengthening;Left;20 reps   Shoulder Flexion Weight (lbs) with rest, used no wgt, 1 lbs      Shoulder Exercises: Sidelying   Flexion AAROM;Strengthening;Left;15 reps   ABduction Strengthening;Left;10 reps   Other Sidelying Exercises scapular neutral      Shoulder Exercises: Standing   Flexion AAROM;Left;15 reps   ABduction AAROM;Left;12 reps   Row  Strengthening;Both;20 reps   Theraband Level (Shoulder Row) Level 1 (Yellow)   Other Standing Exercises semi circle with UE Ranger reaching in all directions with weightshifting      Cryotherapy   Number Minutes Cryotherapy 10 Minutes   Cryotherapy Location Shoulder   Type of Cryotherapy Ice pack     Manual Therapy   Manual Therapy Joint mobilization;Passive ROM   Manual therapy comments manual assist for scapula   Joint Mobilization GR I-II    Passive ROM all planes to tolerance                   PT Short Term Goals - 08/31/16 1115      PT SHORT TERM GOAL #1   Title Pt will be I with initial HEP for AROM and strength of L UE    Status Achieved     PT SHORT TERM GOAL #2   Title Pt will be able to lift L UE to 80 deg with min scapular elevation using visual feedback.    Status On-going     PT SHORT TERM GOAL #3   Title Pt will have AROM of L UE to 120 deg in flexion and scaption in supine    Status On-going     PT SHORT TERM GOAL #4   Title Pt will continue to use her L UE for ADLs with  assist of Rt UE and use caution at work, wean out of sling.    Status On-going           PT Long Term Goals - 08/24/16 1059      PT LONG TERM GOAL #1   Title Pt will be able to lift L UE to 100 deg flexion with good scapular control for functional reach    Status On-going     PT LONG TERM GOAL #2   Title Pt will be able to reach behind her head, back with min pain and min difficulty for ADLs.    Status On-going     PT LONG TERM GOAL #3   Title Pt will demo strength in L UE to 4/5 all planes to maintain stability and balance of shoulder joint.    Status On-going     PT LONG TERM GOAL #4   Title Pt will be I with more advanced HEP for L shoulder ROM, strength    Status On-going     PT LONG TERM GOAL #5   Title FOTO score will improve to by 20% to demo functional gains    Status On-going               Plan - 08/31/16 1110    Clinical Impression Statement  Able to begin gentle strengthening against gravity with some degree of AA.  Will measure next visit.  Able to hold her L arm at 70-80 deg for several seconds.     PT Next Visit Plan Work on scapular stability.  UE ranger AA,  Try row, ext and ER with light bands.  may gradually DC Isometrics Keep things positive.    PT Home Exercise Plan  scap retraction, wall and table slides,  supine cane and isometrics    Consulted and Agree with Plan of Care Patient      Patient will benefit from skilled therapeutic intervention in order to improve the following deficits and impairments:  Decreased scar mobility, Postural dysfunction, Increased edema, Decreased strength, Decreased mobility, Pain, Impaired UE functional use, Obesity, Increased fascial restricitons, Decreased range of motion  Visit Diagnosis: Stiffness of left shoulder, not elsewhere classified  Muscle weakness (generalized)  Acute pain of left shoulder     Problem List Patient Active Problem List   Diagnosis Date Noted  . Closed 4-part fracture of proximal humerus, left, initial encounter 06/18/2016    Hensley,Stephanie 08/31/2016, 11:22 AM  [facility] [address] [place], [place], [address] Phone: [phone]   Fax:  [phone]  Name: Hailee Hollick MRN: [id number] Date of Birth: November 17, 1960  [name], PT 08/31/16 11:22 AM Phone: [phone] Fax: [phone]

## 2016-09-02 ENCOUNTER — Ambulatory Visit: Payer: Self-pay | Admitting: Physical Therapy

## 2016-09-02 ENCOUNTER — Telehealth: Payer: Self-pay | Admitting: Physical Therapy

## 2016-09-02 NOTE — Telephone Encounter (Signed)
I called Stephanie Hensley about her missed appointment today, 8:00 and reminded her of her next appt 09/09/16.

## 2016-09-07 ENCOUNTER — Ambulatory Visit: Payer: Self-pay | Admitting: Physical Therapy

## 2016-09-09 ENCOUNTER — Ambulatory Visit: Payer: Self-pay | Admitting: Physical Therapy

## 2016-09-14 ENCOUNTER — Encounter: Payer: Self-pay | Admitting: Physical Therapy

## 2016-09-16 ENCOUNTER — Ambulatory Visit: Payer: Self-pay | Admitting: Physical Therapy

## 2016-09-17 ENCOUNTER — Ambulatory Visit: Payer: Self-pay | Attending: Orthopedic Surgery | Admitting: Physical Therapy

## 2016-09-17 DIAGNOSIS — M6281 Muscle weakness (generalized): Secondary | ICD-10-CM | POA: Insufficient documentation

## 2016-09-17 DIAGNOSIS — M25512 Pain in left shoulder: Secondary | ICD-10-CM | POA: Insufficient documentation

## 2016-09-17 DIAGNOSIS — M25612 Stiffness of left shoulder, not elsewhere classified: Secondary | ICD-10-CM | POA: Insufficient documentation

## 2016-09-17 NOTE — Therapy (Signed)
[place], [place], [address] Phone: [phone]   Fax:  [phone]  Physical Therapy Treatment  Patient Details  Name: Stephanie Hensley MRN: [id number] Date of Birth: 1961-06-07 Referring Provider: Dr. Ramonita Lab   Encounter Date: 09/17/2016      PT End of Session - 09/17/16 1100    Visit Number 7   Number of Visits 16   Date for PT Re-Evaluation 09/28/16   PT Start Time 9024   PT Stop Time 1115   PT Time Calculation (min) 60 min   Activity Tolerance Patient tolerated treatment well   Behavior During Therapy [facility] for tasks assessed/performed      Past Medical History:  Diagnosis Date  . Asthma   . Headache    migraines in the past  . PTSD (post-traumatic stress disorder)     Past Surgical History:  Procedure Laterality Date  . CHOLECYSTECTOMY    . HERNIA REPAIR     umbilical hernia repair  . KNEE SURGERY    . SHOULDER HEMI-ARTHROPLASTY Left 06/18/2016   Procedure: LEFT SHOULDER HEMI-ARTHROPLASTY;  Surgeon: Nicholes Stairs, MD;  Location: [place];  Service: Orthopedics;  Laterality: Left;  . TUBAL LIGATION      There were no vitals filed for this visit.      Subjective Assessment - 09/17/16 1018    Subjective I've changed my schedule.  Been doing my exercises.  I feel like its a but stronger.  Pain still intermittent.    Currently in Pain? Yes   Pain Score 3    Pain Location Shoulder   Pain Orientation Left   Pain Descriptors / Indicators Aching;Sore   Pain Type Surgical pain   Pain Onset More than a month ago   Pain Frequency Intermittent   Aggravating Factors  using it more and more    Pain Relieving Factors pain meds, rest             [facility] PT Assessment - 09/17/16 1020      AROM   Left Shoulder Flexion 60 Degrees   Left Shoulder ABduction 60 Degrees     PROM   Left Shoulder Flexion 120 Degrees     Strength   Left Shoulder Flexion 2+/5   Left Shoulder ABduction  3-/5                     [facility] Adult PT Treatment/Exercise - 09/17/16 1028      Shoulder Exercises: Seated   External Rotation Strengthening;Both;10 reps   Theraband Level (Shoulder External Rotation) Level 1 (Yellow)   Flexion AAROM;Left;12 reps;Other (comment)   Flexion Weight (lbs) table slides    Abduction AAROM;Left;12 reps   ABduction Weight (lbs) scaption at the table      Shoulder Exercises: ROM/Strengthening   UBE (Upper Arm Bike) NuStep Level 1 for 5 min UE added legs for 2 min    Other ROM/Strengthening Exercises ER stretch at table x 10 x 10 sec    Other ROM/Strengthening Exercises UE Ranger supine cane chest press x 10, over head 0-max degrees (90 deg).  and horiz add/abd x 10 with cane      Cryotherapy   Number Minutes Cryotherapy 10 Minutes   Cryotherapy Location Shoulder   Type of Cryotherapy Ice pack     Manual Therapy   Joint Mobilization Inferior glide seated    Passive ROM all planes to tolerance  PT Education - 09/17/16 1210    Education provided Yes   Education Details goals, progress and overall encouragement , alternatives to stretching to minimize pain    Person(s) Educated Patient   Methods Explanation   Comprehension Verbalized understanding          PT Short Term Goals - 09/17/16 1033      PT SHORT TERM GOAL #1   Title Pt will be I with initial HEP for AROM and strength of L UE    Status Achieved     PT SHORT TERM GOAL #2   Title Pt will be able to lift L UE to 80 deg with min scapular elevation using visual feedback.    Status On-going     PT SHORT TERM GOAL #3   Title Pt will have AROM of L UE to 120 deg in flexion and scaption in supine    Status Partially Met     PT SHORT TERM GOAL #4   Title Pt will continue to use her L UE for ADLs with assist of Rt UE and use caution at work, wean out of sling.    Status Achieved           PT Long Term Goals - 09/17/16 1034      PT LONG TERM GOAL #1    Title Pt will be able to lift L UE to 100 deg flexion with good scapular control for functional reach    Status On-going     PT LONG TERM GOAL #2   Title Pt will be able to reach behind her head, back with min pain and min difficulty for ADLs.    Status On-going     PT LONG TERM GOAL #3   Title Pt will demo strength in L UE to 4/5 all planes to maintain stability and balance of shoulder joint.    Status On-going     PT LONG TERM GOAL #4   Title Pt will be I with more advanced HEP for L shoulder ROM, strength    Status On-going     PT LONG TERM GOAL #5   Title FOTO score will improve to by 20% to demo functional gains    Status On-going               Plan - 09/17/16 1101    Clinical Impression Statement Pt with significant weakness in LUE and compensation using trunk to lift arm.  She was given an option to use the table to stretch arm in flex, scaption and ER.  She has a client who has  Nustep at her home and she hopes to use that for a short time when she is there.  She claims to be working her arm alot but progress is slow.  She missed several appts because she was offered a M-F and needed to accept. She is 12 weeks post and needs to be pushed a bit.    PT Next Visit Plan Work on scapular stability.  UE ranger AA,  Try row, ext and ER with light bands.  may gradually DC Isometrics Keep things positive.    PT Home Exercise Plan  scap retraction, wall and table slides,  supine cane and isometrics    Consulted and Agree with Plan of Care Patient      Patient will benefit from skilled therapeutic intervention in order to improve the following deficits and impairments:  Decreased scar mobility, Postural dysfunction, Increased edema, Decreased strength, Decreased mobility,  Pain, Impaired UE functional use, Obesity, Increased fascial restricitons, Decreased range of motion  Visit Diagnosis: Stiffness of left shoulder, not elsewhere classified  Muscle weakness  (generalized)  Acute pain of left shoulder     Problem List Patient Active Problem List   Diagnosis Date Noted  . Closed 4-part fracture of proximal humerus, left, initial encounter 06/18/2016    PAA,JENNIFER 09/17/2016, 12:13 PM  [place], [place], [address] Phone: [phone]   Fax:  [phone]  Name: Stephanie Hensley MRN: [id number] Date of Birth: July 19, 1960  Raeford Razor, PT 09/17/16 12:14 PM Phone: [phone] Fax: [phone]

## 2016-09-18 ENCOUNTER — Ambulatory Visit: Payer: Self-pay | Admitting: Physical Therapy

## 2016-09-18 DIAGNOSIS — M25612 Stiffness of left shoulder, not elsewhere classified: Secondary | ICD-10-CM

## 2016-09-18 DIAGNOSIS — M25512 Pain in left shoulder: Secondary | ICD-10-CM

## 2016-09-18 DIAGNOSIS — M6281 Muscle weakness (generalized): Secondary | ICD-10-CM

## 2016-09-18 NOTE — Patient Instructions (Signed)
Low Row: Standing    Face anchor, feet shoulder width apart. Palms up, pull arms back, squeezing shoulder blades together. Repeat __10-20 times per set. Do _2-3_ sets per session. Do _DAILY_ sessions per week. Anchor Height: Waist  http://tub.exer.us/66   Copyright  VHI. All rights reserved.   EXTENSION: Standing - Resistance Band: Stable (Active)    Stand, right arm at side. Against yellow resistance band, draw arm backward, as far as possible, keeping elbow straight. Complete __2-3_ sets of _10-20__ repetitions. Perform _2__ sessions per day.  Copyright  VHI. All rights reserved.

## 2016-09-18 NOTE — Therapy (Signed)
Stephanie Hens[place], [place], [address] Phone: [phone]   Fax:  [phone]  Physical Therapy Treatment  Patient Details  Name: Stephanie Hensley MRN: [id number] Date of Birth: [birth year] Referring Provider: Dr. Ramonita Lab   Encounter Date: 09/18/2016      PT End of Session - 09/18/16 0829    Visit Number 8   Number of Visits 16   Date for PT Re-Evaluation 09/28/16   PT Start Time 0804   PT Stop Time 3893   PT Time Calculation (min) 51 min   Activity Tolerance Patient tolerated treatment well   Behavior During Therapy [facility] for tasks assessed/performed      Past Medical History:  Diagnosis Date  . Asthma   . Headache    migraines in the past  . PTSD (post-traumatic stress disorder)     Past Surgical History:  Procedure Laterality Date  . CHOLECYSTECTOMY    . HERNIA REPAIR     umbilical hernia repair  . KNEE SURGERY    . SHOULDER HEMI-ARTHROPLASTY Left 06/18/2016   Procedure: LEFT SHOULDER HEMI-ARTHROPLASTY;  Surgeon: Nicholes Stairs, MD;  Location: [place];  Service: Orthopedics;  Laterality: Left;  . TUBAL LIGATION      There were no vitals filed for this visit.      Subjective Assessment - 09/18/16 0806    Subjective No pain this AM.     Currently in Pain? No/denies            [facility] PT Assessment - 09/18/16 0841      AROM   Left Shoulder Flexion 60 Degrees     PROM   Left Shoulder Flexion 125 Degrees              [facility] Adult PT Treatment/Exercise - 09/18/16 0810      Shoulder Exercises: Supine   Other Supine Exercises supine scap stabilization : punch x 10, 1 lb    Other Supine Exercises small ROM circles x 10 x 2 no wgt.       Shoulder Exercises: Seated   External Rotation Strengthening;Both;10 reps   Theraband Level (Shoulder External Rotation) Level 1 (Yellow)     Shoulder Exercises: Standing   Extension Strengthening;Both;20 reps;Theraband   Theraband Level  (Shoulder Extension) Level 2 (Red)   Row Strengthening;Both;20 reps;Theraband   Theraband Level (Shoulder Row) Level 2 (Red)   Other Standing Exercises adduction x 20 red      Shoulder Exercises: ROM/Strengthening   Other ROM/Strengthening Exercises UE Ranger reaching in all directions standing and seated focus on scapular stabilization.     Cryotherapy   Number Minutes Cryotherapy 10 Minutes   Cryotherapy Location Shoulder   Type of Cryotherapy Ice pack     Manual Therapy   Joint Mobilization Inferior glide seated    Passive ROM all planes to tolerance                 PT Education - 09/18/16 0829    Education provided Yes   Education Details standing scapular HEP    Person(s) Educated Patient   Methods Explanation;Demonstration;Handout   Comprehension Verbalized understanding          PT Short Term Goals - 09/17/16 1033      PT SHORT TERM GOAL #1   Title Pt will be I with initial HEP for AROM and strength of L UE    Status Achieved     PT SHORT TERM GOAL #2   Title  Pt will be able to lift L UE to 80 deg with min scapular elevation using visual feedback.    Status On-going     PT SHORT TERM GOAL #3   Title Pt will have AROM of L UE to 120 deg in flexion and scaption in supine    Status Partially Met     PT SHORT TERM GOAL #4   Title Pt will continue to use her L UE for ADLs with assist of Rt UE and use caution at work, wean out of sling.    Status Achieved           PT Long Term Goals - 09/17/16 1034      PT LONG TERM GOAL #1   Title Pt will be able to lift L UE to 100 deg flexion with good scapular control for functional reach    Status On-going     PT LONG TERM GOAL #2   Title Pt will be able to reach behind her head, back with min pain and min difficulty for ADLs.    Status On-going     PT LONG TERM GOAL #3   Title Pt will demo strength in L UE to 4/5 all planes to maintain stability and balance of shoulder joint.    Status On-going     PT  LONG TERM GOAL #4   Title Pt will be I with more advanced HEP for L shoulder ROM, strength    Status On-going     PT LONG TERM GOAL #5   Title FOTO score will improve to by 20% to demo functional gains    Status On-going               Plan - 09/18/16 1108    Clinical Impression Statement Pt given new HEP focusing on scapular symmetry and stability.  Tolerated today's session well, no pain increase.     PT Next Visit Plan Work on scapular stability.  UE ranger AA,  Try row, ext and ER with light bands.  may gradually DC Isometrics Keep things positive.    PT Home Exercise Plan  scap retraction, wall and table slides,  supine cane and isometrics , standing row and ext red band in doorway   Consulted and Agree with Plan of Care Patient      Patient will benefit from skilled therapeutic intervention in order to improve the following deficits and impairments:  Decreased scar mobility, Postural dysfunction, Increased edema, Decreased strength, Decreased mobility, Pain, Impaired UE functional use, Obesity, Increased fascial restricitons, Decreased range of motion  Visit Diagnosis: Stiffness of left shoulder, not elsewhere classified  Muscle weakness (generalized)  Acute pain of left shoulder     Problem List Patient Active Problem List   Diagnosis Date Noted  . Closed 4-part fracture of proximal humerus, left, initial encounter 06/18/2016    Stephanie Hensley 09/18/2016, 11:10 AM  [place], [place], [address] Phone: [phone]   Fax:  [phone]  Name: Stephanie Hensley MRN: [id number] Date of Birth: [birth year]  Raeford Razor, PT 09/18/16 11:11 AM Phone: [phone] Fax: [phone]

## 2016-09-21 ENCOUNTER — Encounter: Payer: Self-pay | Admitting: Physical Therapy

## 2016-09-22 ENCOUNTER — Ambulatory Visit: Payer: Self-pay | Admitting: Physical Therapy

## 2016-09-22 ENCOUNTER — Encounter: Payer: Self-pay | Admitting: Physical Therapy

## 2016-09-22 DIAGNOSIS — M25512 Pain in left shoulder: Secondary | ICD-10-CM

## 2016-09-22 DIAGNOSIS — M6281 Muscle weakness (generalized): Secondary | ICD-10-CM

## 2016-09-22 DIAGNOSIS — M25612 Stiffness of left shoulder, not elsewhere classified: Secondary | ICD-10-CM

## 2016-09-22 NOTE — Therapy (Signed)
[facility] [address] [place], [place], [address] Phone: [phone]   Fax:  [phone]  Physical Therapy Treatment  Patient Details  Name: Stephanie Hensley MRN: [id number] Date of Birth: [birth year] Referring Provider: Dr. Missy Sabins   Encounter Date: 09/22/2016      PT End of Session - 09/22/16 1503    Visit Number 9   Number of Visits 16   Date for PT Re-Evaluation 09/28/16   PT Start Time 1410   PT Stop Time 1505   PT Time Calculation (min) 55 min   Activity Tolerance Patient tolerated treatment well   Behavior During Therapy [facility] for tasks assessed/performed      Past Medical History:  Diagnosis Date  . Asthma   . Headache    migraines in the past  . PTSD (post-traumatic stress disorder)     Past Surgical History:  Procedure Laterality Date  . CHOLECYSTECTOMY    . HERNIA REPAIR     umbilical hernia repair  . KNEE SURGERY    . SHOULDER HEMI-ARTHROPLASTY Left 06/18/2016   Procedure: LEFT SHOULDER HEMI-ARTHROPLASTY;  Surgeon: Yolonda Kida, MD;  Location: [facility] OR;  Service: Orthopedics;  Laterality: Left;  . TUBAL LIGATION      There were no vitals filed for this visit.      Subjective Assessment - 09/22/16 1407    Subjective Sore earlier from the bands and working out.  Allergies.   Currently in Pain? No/denies   Pain Score --  4-5/10 sore vs pain   Pain Location Shoulder   Pain Orientation Left   Pain Descriptors / Indicators Sore   Pain Type Surgical pain   Pain Radiating Towards NO   Pain Frequency Intermittent   Aggravating Factors  pulling covers up with left arm.  rolling over on left shoulder at night.  new exercises.   Pain Relieving Factors Am pain meds(mild)   Effect of Pain on Daily Activities Limited lifting, wakes at night sometimes.    Multiple Pain Sites --  knee pain with stepping into tub, better with folded towel to cushion,             [facility] PT Assessment - 09/22/16  0001      AROM   Left Shoulder Flexion 60 Degrees                     [facility] Adult PT Treatment/Exercise - 09/22/16 0001      Shoulder Exercises: Supine   Flexion AAROM   Flexion Limitations cues to keep arm in site vs turning head away.  4/10 pain end range   Other Supine Exercises small elbow circles,  triceps,  3-5 reps 2-3 sets each.   fatigues.    Other Supine Exercises light rythemic stab 3 x at 90,       Shoulder Exercises: Seated   External Rotation Strengthening;Both   Theraband Level (Shoulder External Rotation) Level 2 (Red)     Shoulder Exercises: Standing   Extension Strengthening   Theraband Level (Shoulder Extension) Level 2 (Red)   Row Strengthening;Both   Theraband Level (Shoulder Row) Level 2 (Red)   Other Standing Exercises triceps/biceps red band 10 x each   Other Standing Exercises wall push up 5 X, and press and hold and reach. 5 X each alternating.     Shoulder Exercises: ROM/Strengthening   UBE (Upper Arm Bike) Nu step L4, 80 steps/min,  5 minutes     Cryotherapy  Number Minutes Cryotherapy 10 Minutes   Cryotherapy Location Shoulder   Type of Cryotherapy --  cold pack                  PT Short Term Goals - 09/22/16 1507      PT SHORT TERM GOAL #1   Title Pt will be I with initial HEP for AROM and strength of L UE    Time 2   Period Weeks   Status Achieved     PT SHORT TERM GOAL #2   Title Pt will be able to lift L UE to 80 deg with min scapular elevation using visual feedback.    Baseline 60   Time 6   Period Weeks   Status On-going     PT SHORT TERM GOAL #3   Title Pt will have AROM of L UE to 120 deg in flexion and scaption in supine    Time 4   Period Weeks   Status Unable to assess     PT SHORT TERM GOAL #4   Title Pt will continue to use her L UE for ADLs with assist of Rt UE and use caution at work, wean out of sling.    Time 4   Period Weeks   Status Achieved           PT Long Term Goals -  09/17/16 1034      PT LONG TERM GOAL #1   Title Pt will be able to lift L UE to 100 deg flexion with good scapular control for functional reach    Status On-going     PT LONG TERM GOAL #2   Title Pt will be able to reach behind her head, back with min pain and min difficulty for ADLs.    Status On-going     PT LONG TERM GOAL #3   Title Pt will demo strength in L UE to 4/5 all planes to maintain stability and balance of shoulder joint.    Status On-going     PT LONG TERM GOAL #4   Title Pt will be I with more advanced HEP for L shoulder ROM, strength    Status On-going     PT LONG TERM GOAL #5   Title FOTO score will improve to by 20% to demo functional gains    Status On-going               Plan - 09/22/16 1505    Clinical Impression Statement Pain increased to 4/10 with AA shoulder flexion (vs scaption)  bands helpful for strengthenming.  Emotional support given.  Positive focus.    PT Next Visit Plan Work on scapular stability.  UE ranger AA,  Try row, ext and ER with light bands.  may gradually DC Isometrics Keep things positive.   Wall press up and reach   PT Home Exercise Plan  scap retraction, wall and table slides,  supine cane and isometrics , standing row and ext red band in doorway   Consulted and Agree with Plan of Care Patient      Patient will benefit from skilled therapeutic intervention in order to improve the following deficits and impairments:  Decreased scar mobility, Postural dysfunction, Increased edema, Decreased strength, Decreased mobility, Pain, Impaired UE functional use, Obesity, Increased fascial restricitons, Decreased range of motion  Visit Diagnosis: Stiffness of left shoulder, not elsewhere classified  Muscle weakness (generalized)  Acute pain of left shoulder     Problem List  Patient Active Problem List   Diagnosis Date Noted  . Closed 4-part fracture of proximal humerus, left, initial encounter 06/18/2016    Luismario Coston  PTA 09/22/2016, 3:08 PM  [facility] [address] [place], [place], [address] Phone: [phone]   Fax:  [phone]  Name: Midori Dado MRN: [id number] Date of Birth: 1961-05-28

## 2016-09-23 ENCOUNTER — Encounter: Payer: Self-pay | Admitting: Physical Therapy

## 2016-09-24 ENCOUNTER — Ambulatory Visit: Payer: Self-pay | Admitting: Physical Therapy

## 2016-09-24 DIAGNOSIS — M25612 Stiffness of left shoulder, not elsewhere classified: Secondary | ICD-10-CM

## 2016-09-24 DIAGNOSIS — M6281 Muscle weakness (generalized): Secondary | ICD-10-CM

## 2016-09-24 DIAGNOSIS — M25512 Pain in left shoulder: Secondary | ICD-10-CM

## 2016-09-24 NOTE — Therapy (Signed)
[facility] [address] [place], [place], [address] Phone: [phone]   Fax:  [phone]  Physical Therapy Treatment and Certification  Patient Details  Name: Stephanie Hensley MRN: [id number] Date of Birth: 1960-08-05 Referring Provider: Dr. Missy Sabins   Encounter Date: 09/24/2016      PT End of Session - 09/24/16 1019    Visit Number 10   Number of Visits 26   Date for PT Re-Evaluation 11/19/16   PT Start Time 1015   PT Stop Time 1108   PT Time Calculation (min) 53 min   Activity Tolerance Patient tolerated treatment well   Behavior During Therapy [facility] for tasks assessed/performed      Past Medical History:  Diagnosis Date  . Asthma   . Headache    migraines in the past  . PTSD (post-traumatic stress disorder)     Past Surgical History:  Procedure Laterality Date  . CHOLECYSTECTOMY    . HERNIA REPAIR     umbilical hernia repair  . KNEE SURGERY    . SHOULDER HEMI-ARTHROPLASTY Left 06/18/2016   Procedure: LEFT SHOULDER HEMI-ARTHROPLASTY;  Surgeon: Yolonda Kida, MD;  Location: [facility] OR;  Service: Orthopedics;  Laterality: Left;  . TUBAL LIGATION      There were no vitals filed for this visit.      Subjective Assessment - 09/24/16 1018    Subjective Has not slept well for the past 2 nights.    Currently in Pain? Yes   Pain Score 1    Pain Location Shoulder   Pain Orientation Left   Pain Descriptors / Indicators Sore   Pain Type Surgical pain   Pain Onset More than a month ago   Pain Frequency Intermittent   Aggravating Factors  using arm for ADLs, overdoing it    Pain Relieving Factors rest, meds, rice             [facility] PT Assessment - 09/24/16 1031      Observation/Other Assessments   Focus on Therapeutic Outcomes (FOTO)  54%     AROM   Left Shoulder Flexion 64 Degrees   Left Shoulder ABduction 60 Degrees   Left Shoulder Internal Rotation 65 Degrees   Left Shoulder External Rotation 25  Degrees     PROM   Left Shoulder ABduction 90 Degrees   Left Shoulder Internal Rotation 75 Degrees   Left Shoulder External Rotation 38 Degrees     Strength   Left Shoulder Flexion 2+/5   Left Shoulder ABduction 3-/5   Left Shoulder Internal Rotation 3/5   Left Shoulder External Rotation 3-/5                     [facility] Adult PT Treatment/Exercise - 09/24/16 1049      Neuro Re-ed    Neuro Re-ed Details  used seatbelt strap to offer L upper trap inhibition for arm elevation.       Shoulder Exercises: Supine   Flexion AROM;Strengthening;Left;10 reps   Flexion Limitations bends elbow, 2-3 sets      Shoulder Exercises: Standing   Flexion Strengthening;Left;10 reps   ABduction Strengthening;Left;10 reps   Other Standing Exercises elevation and scaption AROM against gravity      Shoulder Exercises: Pulleys   Flexion 3 minutes     Shoulder Exercises: ROM/Strengthening   Other ROM/Strengthening Exercises Supine UE ranger in flexion and ER x 20 each      Cryotherapy   Number Minutes Cryotherapy 8 Minutes  Cryotherapy Location Shoulder   Type of Cryotherapy Ice pack     Manual Therapy   Manual Therapy Taping   McConnell tape to inhbit L upper trap 2 strips                 PT Education - 09/24/16 1233    Education provided Yes   Education Details use of lower trap to avoid hiking shoulder    Person(s) Educated Patient   Methods Explanation   Comprehension Verbalized understanding          PT Short Term Goals - 09/22/16 1507      PT SHORT TERM GOAL #1   Title Pt will be I with initial HEP for AROM and strength of L UE    Time 2   Period Weeks   Status Achieved     PT SHORT TERM GOAL #2   Title Pt will be able to lift L UE to 80 deg with min scapular elevation using visual feedback.    Baseline 60   Time 6   Period Weeks   Status On-going     PT SHORT TERM GOAL #3   Title Pt will have AROM of L UE to 120 deg in flexion and scaption in  supine    Time 4   Period Weeks   Status Unable to assess     PT SHORT TERM GOAL #4   Title Pt will continue to use her L UE for ADLs with assist of Rt UE and use caution at work, wean out of sling.    Time 4   Period Weeks   Status Achieved           PT Long Term Goals - 09/24/16 1251      PT LONG TERM GOAL #1   Title Pt will be able to lift L UE to 100 deg flexion with good scapular control for functional reach    Status On-going     PT LONG TERM GOAL #2   Title Pt will be able to reach behind her head, back with min pain and min difficulty for ADLs.    Status On-going     PT LONG TERM GOAL #3   Title Pt will demo strength in L UE to 4/5 all planes to maintain stability and balance of shoulder joint.    Status On-going     PT LONG TERM GOAL #4   Title Pt will be I with more advanced HEP for L shoulder ROM, strength    Status On-going     PT LONG TERM GOAL #5   Title FOTO score will improve to by 20% to demo functional gains    Status On-going               Plan - 09/24/16 1238    Clinical Impression Statement Patient has been having more pain but continues to use her L arm when she can for ADL, some light work duties and mobility.  She has gained much AROM of in flexion and abduction, continues to have frustration from this.  She is working on using scapular stabilizers as she lifts her arm but she lacks the neuromuscular control.  Her FOTO score has improved by over 20 points.  She definitely needs more time to improve all aspects of mobility and self care.  Still awaiting financial assist.    PT Next Visit Plan Work on scapular stability.  UE ranger AA,  Try row, ext and ER  with light bands.  may gradually DC Isometrics Keep things positive.   Wall press up and reach   PT Home Exercise Plan  scap retraction, wall and table slides,  supine cane and isometrics , standing row and ext red band in doorway   Consulted and Agree with Plan of Care Patient       Patient will benefit from skilled therapeutic intervention in order to improve the following deficits and impairments:  Decreased scar mobility, Postural dysfunction, Increased edema, Decreased strength, Decreased mobility, Pain, Impaired UE functional use, Obesity, Increased fascial restricitons, Decreased range of motion  Visit Diagnosis: Stiffness of left shoulder, not elsewhere classified  Muscle weakness (generalized)  Acute pain of left shoulder     Problem List Patient Active Problem List   Diagnosis Date Noted  . Closed 4-part fracture of proximal humerus, left, initial encounter 06/18/2016    Reegan Mctighe 09/24/2016, 12:53 PM  [facility] [address] [place], [place], [address] Phone: [phone]   Fax:  [phone]  Name: Stephanie Hensley MRN: [id number] Date of Birth: 09-11-1960  [name], PT 09/24/16 12:54 PM Phone: [phone] Fax: [phone]

## 2016-09-28 ENCOUNTER — Ambulatory Visit: Payer: Self-pay | Admitting: Physical Therapy

## 2016-09-30 ENCOUNTER — Ambulatory Visit: Payer: Self-pay | Admitting: Physical Therapy

## 2016-10-01 ENCOUNTER — Ambulatory Visit: Payer: Self-pay | Admitting: Physical Therapy

## 2016-10-06 ENCOUNTER — Ambulatory Visit: Payer: Self-pay | Attending: Orthopedic Surgery | Admitting: Physical Therapy

## 2016-10-08 ENCOUNTER — Ambulatory Visit: Payer: Self-pay | Admitting: Physical Therapy

## 2016-10-13 ENCOUNTER — Ambulatory Visit: Payer: Self-pay | Admitting: Physical Therapy

## 2016-10-15 ENCOUNTER — Ambulatory Visit: Payer: Self-pay | Admitting: Physical Therapy

## 2016-10-20 ENCOUNTER — Ambulatory Visit: Payer: Self-pay | Admitting: Physical Therapy

## 2016-10-22 ENCOUNTER — Ambulatory Visit: Payer: Self-pay | Admitting: Physical Therapy

## 2017-12-22 ENCOUNTER — Other Ambulatory Visit: Payer: Self-pay

## 2017-12-22 ENCOUNTER — Telehealth (INDEPENDENT_AMBULATORY_CARE_PROVIDER_SITE_OTHER): Payer: Self-pay | Admitting: Orthopedic Surgery

## 2017-12-22 ENCOUNTER — Emergency Department (HOSPITAL_COMMUNITY): Payer: Self-pay

## 2017-12-22 ENCOUNTER — Emergency Department (HOSPITAL_COMMUNITY)
Admission: EM | Admit: 2017-12-22 | Discharge: 2017-12-22 | Disposition: A | Discharge status: Home / Self Care | Payer: Self-pay | Attending: Emergency Medicine | Admitting: Emergency Medicine

## 2017-12-22 ENCOUNTER — Encounter (HOSPITAL_COMMUNITY): Payer: Self-pay | Admitting: Emergency Medicine

## 2017-12-22 DIAGNOSIS — Y939 Activity, unspecified: Secondary | ICD-10-CM | POA: Insufficient documentation

## 2017-12-22 DIAGNOSIS — M84361A Stress fracture, right tibia, initial encounter for fracture: Secondary | ICD-10-CM | POA: Insufficient documentation

## 2017-12-22 DIAGNOSIS — X500XXA Overexertion from strenuous movement or load, initial encounter: Secondary | ICD-10-CM | POA: Insufficient documentation

## 2017-12-22 DIAGNOSIS — Z87891 Personal history of nicotine dependence: Secondary | ICD-10-CM | POA: Insufficient documentation

## 2017-12-22 DIAGNOSIS — Y999 Unspecified external cause status: Secondary | ICD-10-CM | POA: Insufficient documentation

## 2017-12-22 DIAGNOSIS — J45909 Unspecified asthma, uncomplicated: Secondary | ICD-10-CM | POA: Insufficient documentation

## 2017-12-22 DIAGNOSIS — Y929 Unspecified place or not applicable: Secondary | ICD-10-CM | POA: Insufficient documentation

## 2017-12-22 DIAGNOSIS — Z79899 Other long term (current) drug therapy: Secondary | ICD-10-CM | POA: Insufficient documentation

## 2017-12-22 MED ORDER — DOXYCYCLINE HYCLATE 100 MG PO CAPS: 100.0000 mg | ORAL_CAPSULE | Freq: Two times a day (BID) | ORAL | 0 refills | Status: DC

## 2017-12-22 MED ORDER — KETOROLAC TROMETHAMINE 60 MG/2ML IM SOLN
60.0000 mg | Freq: Once | INTRAMUSCULAR | Status: AC
  Administered 2017-12-22: 60 mg via INTRAMUSCULAR
  Filled 2017-12-22: qty 2

## 2017-12-22 MED ORDER — IBUPROFEN 800 MG PO TABS: 800.0000 mg | ORAL_TABLET | Freq: Three times a day (TID) | ORAL | 0 refills | Status: DC

## 2017-12-22 NOTE — ED Triage Notes (Signed)
Patient here from home with complaints of right leg pain x2 days. Denies fall, denies trauma.

## 2017-12-22 NOTE — ED Provider Notes (Signed)
[facility]   CSN: [id number] Arrival date & time: 12/22/17  [id number]     History   Chief Complaint Chief Complaint  Patient presents with  . Leg Pain    HPI Stephanie Hensley is a 57 y.o. female.  57 year old female presents with several days of right anterior tibia pain.  Denies any trauma.  Pain characterizes sharp and worse with standing.  Has been medicating with Motrin with limited relief.  Denies any right knee or right hip pain.  No right ankle or foot pain.  Has been medicating with heat therapy as well 2.  No fever or chills.  Symptoms better with remaining still     Past Medical History:  Diagnosis Date  . Asthma   . Headache    migraines in the past  . PTSD (post-traumatic stress disorder)     Patient Active Problem List   Diagnosis Date Noted  . Closed 4-part fracture of proximal humerus, left, initial encounter 06/18/2016    Past Surgical History:  Procedure Laterality Date  . CHOLECYSTECTOMY    . HERNIA REPAIR     umbilical hernia repair  . KNEE SURGERY    . SHOULDER HEMI-ARTHROPLASTY Left 06/18/2016   Procedure: LEFT SHOULDER HEMI-ARTHROPLASTY;  Surgeon: Yolonda KidaJason Patrick Rogers, MD;  [facility];  Service: Orthopedics;  Laterality: Left;  . TUBAL LIGATION       OB History   None      Home Medications    Prior to Admission medications   Medication Sig Start Date End Date Taking? Authorizing Provider  albuterol (PROVENTIL HFA;VENTOLIN HFA) 108 (90 Base) MCG/ACT inhaler Inhale 1-2 puffs into the lungs every 6 (six) hours as needed for wheezing or shortness of breath.    [provider]  cetirizine (ZYRTEC) 10 MG tablet Take 10 mg by mouth daily.    [provider]  cyclobenzaprine (FLEXERIL) 10 MG tablet Take 0.5-1 tablets (5-10 mg total) by mouth 2 (two) times daily as needed for muscle spasms. 06/01/16   Marlon PelGreene, Tiffany, PA-C  diphenhydrAMINE (BENADRYL) 25 MG tablet Take 25 mg by  mouth every 6 (six) hours as needed. Pt taking 1 a day for itching caused by Hydrocodone    [provider]  HYDROcodone-acetaminophen (NORCO/VICODIN) 5-325 MG tablet Take 2 tablets by mouth every 4 (four) hours as needed. Patient taking differently: Take 2 tablets by mouth every 4 (four) hours as needed (for pain).  06/01/16   Marlon PelGreene, Tiffany, PA-C  ondansetron (ZOFRAN) 4 MG tablet Take 1 tablet (4 mg total) by mouth every 6 (six) hours. 06/01/16   Marlon PelGreene, Tiffany, PA-C  oxyCODONE (OXY IR/ROXICODONE) 5 MG immediate release tablet Take 1-2 tablets (5-10 mg total) by mouth every 4 (four) hours as needed for severe pain or breakthrough pain. 06/19/16   Yolonda Kidaogers, Jason Patrick, MD    Family History Family History  Problem Relation Age of Onset  . Cancer Mother   . Heart disease Father     Social History Social History   Tobacco Use  . Smoking status: Former Smoker    Types: Cigarettes    Last attempt to quit: 06/17/2012    Years since quitting: 5.5  . Smokeless tobacco: Never Used  Substance Use Topics  . Alcohol use: No  . Drug use: No     Allergies   Patient has no known allergies.   Review of Systems Review of Systems  All other systems reviewed and are negative.  Physical Exam Updated Vital Signs BP (!) 150/66 (BP Location: Left Arm)   Pulse 82   Temp 98 F (36.7 C) (Oral)   Resp 19   LMP 12/24/2010   SpO2 100%   Physical Exam  Constitutional: She is oriented to person, place, and time. She appears well-developed and well-nourished.  Non-toxic appearance. No distress.  HENT:  Head: Normocephalic and atraumatic.  Eyes: Pupils are equal, round, and reactive to light. Conjunctivae, EOM and lids are normal.  Neck: Normal range of motion. Neck supple. No tracheal deviation present. No thyroid mass present.  Cardiovascular: Normal rate, regular rhythm and normal heart sounds. Exam reveals no gallop.  No murmur heard. Pulmonary/Chest: Effort normal and breath  sounds normal. No stridor. No respiratory distress. She has no decreased breath sounds. She has no wheezes. She has no rhonchi. She has no rales.  Abdominal: Soft. Normal appearance and bowel sounds are normal. She exhibits no distension. There is no tenderness. There is no rebound and no CVA tenderness.  Musculoskeletal: Normal range of motion. She exhibits no edema or tenderness.       Legs: Neurological: She is alert and oriented to person, place, and time. She has normal strength. No cranial nerve deficit or sensory deficit. GCS eye subscore is 4. GCS verbal subscore is 5. GCS motor subscore is 6.  Skin: Skin is warm and dry. No abrasion and no rash noted.  Psychiatric: She has a normal mood and affect. Her speech is normal and behavior is normal.  Nursing note and vitals reviewed.    ED Treatments / Results  Labs (all labs ordered are listed, but only abnormal results are displayed) Labs Reviewed - No data to display  EKG None  Radiology No results found.  Procedures Procedures (including critical care time)  Medications Ordered in ED Medications  ketorolac (TORADOL) injection 60 mg (has no administration in time range)     Initial Impression / Assessment and Plan / ED Course  I have reviewed the triage vital signs and the nursing notes.  Pertinent labs & imaging results that were available during my care of the patient were reviewed by me and considered in my medical decision making (see chart for details).    Patient given Toradol here and feels better.  X-ray shows evidence of proximal nondisplaced tibial fracture.  Discussed with Dr. August Saucer from orthopedics and he recommends patient be placed in a knee immobilizer and given crutches and he will see the patient on Friday.  Final Clinical Impressions(s) / ED Diagnoses   Final diagnoses:  None    ED Discharge Orders    None       Lorre Nick, MD 12/22/17 1147

## 2017-12-22 NOTE — Telephone Encounter (Signed)
Returned call to patient left message to return call. Patient stated she has a stress Fx right tibia

## 2017-12-22 NOTE — ED Notes (Signed)
Patient verbalized understanding of discharge instructions and prescriptions, no questions. Patient out of ED via wheelchair in no distress. Crutches and cane taken with patient.

## 2017-12-22 NOTE — Discharge Instructions (Addendum)
Call Dr. Diamantina Providenceean's office today to schedule an appointment to be seen on Friday Take 1 baby aspirin a day

## 2017-12-24 ENCOUNTER — Ambulatory Visit (INDEPENDENT_AMBULATORY_CARE_PROVIDER_SITE_OTHER): Payer: Self-pay

## 2017-12-24 ENCOUNTER — Ambulatory Visit (INDEPENDENT_AMBULATORY_CARE_PROVIDER_SITE_OTHER): Payer: Self-pay | Admitting: Orthopedic Surgery

## 2017-12-24 ENCOUNTER — Encounter (INDEPENDENT_AMBULATORY_CARE_PROVIDER_SITE_OTHER): Payer: Self-pay | Admitting: Orthopedic Surgery

## 2017-12-24 DIAGNOSIS — M79604 Pain in right leg: Secondary | ICD-10-CM

## 2017-12-24 DIAGNOSIS — S82224A Nondisplaced transverse fracture of shaft of right tibia, initial encounter for closed fracture: Secondary | ICD-10-CM

## 2017-12-24 MED ORDER — IBUPROFEN 800 MG PO TABS: 800.0000 mg | ORAL_TABLET | Freq: Three times a day (TID) | ORAL | 0 refills | Status: DC | PRN

## 2017-12-26 ENCOUNTER — Encounter (INDEPENDENT_AMBULATORY_CARE_PROVIDER_SITE_OTHER): Payer: Self-pay | Admitting: Orthopedic Surgery

## 2017-12-26 NOTE — Progress Notes (Signed)
Office Visit Note   Patient: Stephanie Hensley           Date of Birth: Jun 30, 1960           MRN: [id number] Visit Date: 12/24/2017 Requested by: No referring provider defined for this encounter. PCP: Patient, No Pcp Per  Subjective: Chief Complaint  Patient presents with  . Right Knee - Pain    HPI: Patient presents for evaluation of right leg pain.  Date of injury 12/21/2017.  She tripped going up the steps and developed significant increase in her right tibial pain.  Prior to the injury she was diagnosed with shin splints.  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  She ambulates with a cane and immobilizer.  She takes ibuprofen as needed.  Radiographs from the hospital demonstrate a transverse tibial shaft fracture.  She states she was having significant pain in that right tibial region prior to this "injury".  She has been able to ambulate with a knee immobilizer.  She has not been doing as much as she normally does              ROS: All systems reviewed are negative as they relate to the chief complaint within the history of present illness.  Patient denies  fevers or chills.   Assessment & Plan: Visit Diagnoses:  1. Pain in right leg   2. Closed nondisplaced transverse fracture of shaft of right tibia, initial encounter     Plan: Impression is acute nondisplaced right tibial shaft fracture which may be ablation of a stress fracture.  Discussed with her at this time that the standard treatment for this type of problem would be intramedullary nail fixation of the fracture.  She wants to avoid that at all costs.  She has not displaced the fracture or 2 days of walking on it in a knee immobilizer.  I think if she limits her activities she may be able to get over this without surgery.  I do want her to start taking calcium and vitamin D.  2-week return for repeat radiographs.  Start taking aspirin twice a day as well for DVT prophylaxis.  Follow-Up Instructions: Return in about 2 weeks  (around 01/07/2018).   Orders:  Orders Placed This Encounter  Procedures  . XR Tibia/Fibula Right   Meds ordered this encounter  Medications  . ibuprofen (ADVIL,MOTRIN) 800 MG tablet    Sig: Take 1 tablet (800 mg total) by mouth every 8 (eight) hours as needed.    Dispense:  30 tablet    Refill:  0      Procedures: No procedures performed   Clinical Data: No additional findings.  Objective: Vital Signs: LMP 12/24/2010   Physical Exam:   Constitutional: Patient appears well-developed HEENT:  Head: Normocephalic Eyes:EOM are normal Neck: Normal range of motion Cardiovascular: Normal rate Pulmonary/chest: Effort normal Neurologic: Patient is alert Skin: Skin is warm Psychiatric: Patient has normal mood and affect    Ortho Exam: Ortho examination demonstrates some tenderness in the tibial shaft region to direct palpation on the right.  Compartments are soft.  Pedal pulses palpable.  No fibular tenderness is present.  Knee range of motion is full.  No calf tenderness present and negative Homans signs on the right.  Specialty Comments:  No specialty comments available.  Imaging: No results found.   PMFS History: Patient Active Problem List   Diagnosis Date Noted  . Closed 4-part fracture of proximal humerus, left, initial encounter  06/18/2016   Past Medical History:  Diagnosis Date  . Asthma   . Headache    migraines in the past  . PTSD (post-traumatic stress disorder)     Family History  Problem Relation Age of Onset  . Cancer Mother   . Heart disease Father     Past Surgical History:  Procedure Laterality Date  . CHOLECYSTECTOMY    . HERNIA REPAIR     umbilical hernia repair  . KNEE SURGERY    . SHOULDER HEMI-ARTHROPLASTY Left 06/18/2016   Procedure: LEFT SHOULDER HEMI-ARTHROPLASTY;  Surgeon: Yolonda KidaJason Patrick Rogers, MD;  [facility];  Service: Orthopedics;  Laterality: Left;  . TUBAL LIGATION     Social History   Occupational History  . Not  on file  Tobacco Use  . Smoking status: Former Smoker    Types: Cigarettes    Last attempt to quit: 06/17/2012    Years since quitting: 5.5  . Smokeless tobacco: Never Used  Substance and Sexual Activity  . Alcohol use: No  . Drug use: No  . Sexual activity: Not on file

## 2017-12-27 ENCOUNTER — Telehealth (INDEPENDENT_AMBULATORY_CARE_PROVIDER_SITE_OTHER): Payer: Self-pay | Admitting: Orthopedic Surgery

## 2017-12-27 NOTE — Telephone Encounter (Signed)
Appt issue  Change appt July 31th from Aug 1st   Please call pt to discuss pt care,pt is unable to drive due to Right leg injury.

## 2017-12-27 NOTE — Telephone Encounter (Signed)
That will be fine...we have to see her because she is in fracture care. So if she needs to come in on 07/31 please schedule for morning or early afternoon. Thanks.

## 2018-01-05 ENCOUNTER — Ambulatory Visit (INDEPENDENT_AMBULATORY_CARE_PROVIDER_SITE_OTHER): Payer: Self-pay

## 2018-01-05 ENCOUNTER — Ambulatory Visit (INDEPENDENT_AMBULATORY_CARE_PROVIDER_SITE_OTHER): Payer: Self-pay | Admitting: Orthopedic Surgery

## 2018-01-05 ENCOUNTER — Encounter (INDEPENDENT_AMBULATORY_CARE_PROVIDER_SITE_OTHER): Payer: Self-pay | Admitting: Orthopedic Surgery

## 2018-01-05 DIAGNOSIS — S82224A Nondisplaced transverse fracture of shaft of right tibia, initial encounter for closed fracture: Secondary | ICD-10-CM

## 2018-01-05 MED ORDER — METHOCARBAMOL 500 MG PO TABS: ORAL_TABLET | ORAL | 0 refills | Status: DC

## 2018-01-05 MED ORDER — HYDROCODONE-ACETAMINOPHEN 5-325 MG PO TABS: ORAL_TABLET | ORAL | 0 refills | Status: DC

## 2018-01-05 NOTE — Progress Notes (Signed)
   Post-Op Visit Note   Patient: Stephanie Hensley           Date of Birth: 08-Jul-1960           MRN: [id number] Visit Date: 01/05/2018 PCP: Patient, No Pcp Per   Assessment & Plan:  Chief Complaint:  Chief Complaint  Patient presents with  . Right Leg - Follow-up, Fracture   Visit Diagnoses:  1. Closed nondisplaced transverse fracture of shaft of right tibia, initial encounter     Plan: Stephanie Hensley is a patient with right leg tibial stress fracture.  She has been doing reasonably well walking in the knee immobilizer.  She has been curtailing her activity.  She is also been taking calcium and vitamin D.  On examination she has mild tenderness of the proximal tibia but no calf tenderness is present.  Negative Homans sign.  She has reasonable knee range of motion.  No swelling or ecchymosis or bruising is present.  Plan is to refill Robaxin.  One-time prescription for Norco written.  Continue with current level of activity.  4-week return with repeat radiographs at that time  Follow-Up Instructions: Return in about 1 month (around 02/02/2018).   Orders:  Orders Placed This Encounter  Procedures  . XR Tibia/Fibula Right   Meds ordered this encounter  Medications  . HYDROcodone-acetaminophen (NORCO/VICODIN) 5-325 MG tablet    Sig: 1 po q d prn pain    Dispense:  25 tablet    Refill:  0  . methocarbamol (ROBAXIN) 500 MG tablet    Sig: 1 po q hs prn pain    Dispense:  30 tablet    Refill:  0    Imaging: Xr Tibia/fibula Right  Result Date: 01/05/2018 AP lateral right tib-fib reviewed.  Again noted a stress fracture in the proximal tibial shaft.  No evidence of change of position or malalignment.  No definite callus formation yet.   PMFS History: Patient Active Problem List   Diagnosis Date Noted  . Closed 4-part fracture of proximal humerus, left, initial encounter 06/18/2016   Past Medical History:  Diagnosis Date  . Asthma   . Headache    migraines in the past  . PTSD  (post-traumatic stress disorder)     Family History  Problem Relation Age of Onset  . Cancer Mother   . Heart disease Father     Past Surgical History:  Procedure Laterality Date  . CHOLECYSTECTOMY    . HERNIA REPAIR     umbilical hernia repair  . KNEE SURGERY    . SHOULDER HEMI-ARTHROPLASTY Left 06/18/2016   Procedure: LEFT SHOULDER HEMI-ARTHROPLASTY;  Surgeon: Yolonda KidaJason Patrick Rogers, MD;  [facility];  Service: Orthopedics;  Laterality: Left;  . TUBAL LIGATION     Social History   Occupational History  . Not on file  Tobacco Use  . Smoking status: Former Smoker    Types: Cigarettes    Last attempt to quit: 06/17/2012    Years since quitting: 5.5  . Smokeless tobacco: Never Used  Substance and Sexual Activity  . Alcohol use: No  . Drug use: No  . Sexual activity: Not on file

## 2018-01-06 ENCOUNTER — Ambulatory Visit (INDEPENDENT_AMBULATORY_CARE_PROVIDER_SITE_OTHER): Payer: Self-pay | Admitting: Orthopedic Surgery

## 2018-01-11 ENCOUNTER — Ambulatory Visit (INDEPENDENT_AMBULATORY_CARE_PROVIDER_SITE_OTHER): Payer: Self-pay | Admitting: Surgery

## 2018-01-11 ENCOUNTER — Emergency Department (HOSPITAL_COMMUNITY): Payer: Self-pay

## 2018-01-11 ENCOUNTER — Encounter (HOSPITAL_COMMUNITY): Payer: Self-pay

## 2018-01-11 ENCOUNTER — Emergency Department (HOSPITAL_COMMUNITY)
Admission: EM | Admit: 2018-01-11 | Discharge: 2018-01-11 | Disposition: A | Discharge status: Home / Self Care | Payer: Self-pay | Attending: Emergency Medicine | Admitting: Emergency Medicine

## 2018-01-11 DIAGNOSIS — S82101D Unspecified fracture of upper end of right tibia, subsequent encounter for closed fracture with routine healing: Secondary | ICD-10-CM | POA: Insufficient documentation

## 2018-01-11 DIAGNOSIS — Z87891 Personal history of nicotine dependence: Secondary | ICD-10-CM | POA: Insufficient documentation

## 2018-01-11 DIAGNOSIS — Z79899 Other long term (current) drug therapy: Secondary | ICD-10-CM | POA: Insufficient documentation

## 2018-01-11 DIAGNOSIS — J45909 Unspecified asthma, uncomplicated: Secondary | ICD-10-CM | POA: Insufficient documentation

## 2018-01-11 DIAGNOSIS — X58XXXD Exposure to other specified factors, subsequent encounter: Secondary | ICD-10-CM | POA: Insufficient documentation

## 2018-01-11 MED ORDER — MORPHINE SULFATE (PF) 4 MG/ML IV SOLN
4.0000 mg | Freq: Once | INTRAVENOUS | Status: DC
  Filled 2018-01-11: qty 1

## 2018-01-11 MED ORDER — ONDANSETRON 4 MG PO TBDP
4.0000 mg | ORAL_TABLET | Freq: Once | ORAL | Status: AC
  Administered 2018-01-11: 4 mg via ORAL
  Filled 2018-01-11: qty 1

## 2018-01-11 MED ORDER — MORPHINE SULFATE (PF) 4 MG/ML IV SOLN
4.0000 mg | Freq: Once | INTRAVENOUS | Status: AC
  Administered 2018-01-11: 4 mg via INTRAVENOUS

## 2018-01-11 MED ORDER — HYDROCODONE-ACETAMINOPHEN 5-325 MG PO TABS
1.0000 | ORAL_TABLET | Freq: Once | ORAL | Status: AC
  Administered 2018-01-11: 1 via ORAL
  Filled 2018-01-11: qty 1

## 2018-01-11 NOTE — Discharge Instructions (Addendum)
Your x-rays have been reviewed with Dr. Cleophas DunkerWhitfield with orthopedics who feels that the leg is continuing to show signs of healing, there is no evidence of new fractures.  Please continue with the ibuprofen and hydrocodone as prescribed by Dr. August Saucerean for pain, ice and elevate the leg as much as possible.  You may use the wheelchair provided, please remain in knee immobilizer brace and use walker with minimal weightbearing when needed.  Please follow-up with Dr. August Saucerean in about 1 week, Burna MortimerWanda with case management has contacted their office to help facilitate an earlier appointment.
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
## 2018-01-11 NOTE — Care Management (Signed)
ED CM met with patient at bedside, patient reports not being able to perform ADL due inability to weight bear or ambulate. Patient has a tibial fracture with rt leg in a splint she has been followed by Ortho and has appointment to f/u on 8/28 with Dr. Marlou Sa. CM encouraged patient to check  [employer] or churches for possible donated w/c. Discussed assistance with finding a PCP at one of the  [facility] clinic[facility], patient is agreeable. CM explained that the [facility] is accepting new patient and patient will need to call tomorrow in the am to schedule an appointment. Patient is agreeable to care plan. Updated Merleen Nicely PA-C she is agreeable.

## 2018-01-11 NOTE — ED Notes (Signed)
Pt's R leg elevated and ice pack applied; pt given PB and crackers per PA

## 2018-01-11 NOTE — ED Notes (Signed)
Patient transported to X-ray 

## 2018-01-11 NOTE — Progress Notes (Signed)
CSW and RNCM met with pt at pt's bedside.  CSW provided information on applying for SNAP and Medicaid benefits.   Stephanie Hensley, [facility]  [phone]

## 2018-01-13 ENCOUNTER — Telehealth (INDEPENDENT_AMBULATORY_CARE_PROVIDER_SITE_OTHER): Payer: Self-pay | Admitting: Orthopedic Surgery

## 2018-01-13 NOTE — Telephone Encounter (Signed)
Patient is asking for a call back, she said it is regarding her right leg which she was seen in the ED for on 8/6. She asks you look at that report and return her call # [phone]

## 2018-01-13 NOTE — Telephone Encounter (Signed)
Can you please review patients recent xray from ER visit on 08/06 in comparison to her last xrays done when she saw Dr August Saucerean last? Patient was doing well with minimal ambulation with knee immobilizer and walker until her 90 pound dog ran up behind her and caused her leg to buckle.  She was being treated for tib fx. Do you think I should bring her in sooner?

## 2018-01-13 NOTE — Telephone Encounter (Signed)
Patient called back and advised her of your message.  She wanted to make sure that both sets of x-rays from 8/6 will be looked at because the second set shows something different from the first one.
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
## 2018-01-14 NOTE — Telephone Encounter (Signed)
Please review and advise. Thanks.  

## 2018-01-18 NOTE — Telephone Encounter (Signed)
She wants to avoid surgery at all cost.  In general the less walking and less weightbearing she does the better.  She should come in next week for follow-up appointment and repeat radiographs.

## 2018-01-18 NOTE — Telephone Encounter (Signed)
IC s/w patient. Appt scheduled for next Wednesday

## 2018-01-26 ENCOUNTER — Encounter (INDEPENDENT_AMBULATORY_CARE_PROVIDER_SITE_OTHER): Payer: Self-pay | Admitting: Orthopedic Surgery

## 2018-01-26 ENCOUNTER — Ambulatory Visit (INDEPENDENT_AMBULATORY_CARE_PROVIDER_SITE_OTHER): Payer: Self-pay

## 2018-01-26 ENCOUNTER — Ambulatory Visit (INDEPENDENT_AMBULATORY_CARE_PROVIDER_SITE_OTHER): Payer: Self-pay | Admitting: Orthopedic Surgery

## 2018-01-26 DIAGNOSIS — S82224A Nondisplaced transverse fracture of shaft of right tibia, initial encounter for closed fracture: Secondary | ICD-10-CM

## 2018-01-26 MED ORDER — IBUPROFEN 800 MG PO TABS: ORAL_TABLET | ORAL | 0 refills | Status: DC
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
## 2018-02-02 ENCOUNTER — Ambulatory Visit (INDEPENDENT_AMBULATORY_CARE_PROVIDER_SITE_OTHER): Payer: Self-pay | Admitting: Orthopedic Surgery

## 2018-02-03 ENCOUNTER — Telehealth (INDEPENDENT_AMBULATORY_CARE_PROVIDER_SITE_OTHER): Payer: Self-pay | Admitting: Orthopedic Surgery

## 2018-02-03 DIAGNOSIS — S82224A Nondisplaced transverse fracture of shaft of right tibia, initial encounter for closed fracture: Secondary | ICD-10-CM

## 2018-02-03 NOTE — Telephone Encounter (Signed)
Patient called to request an order for a MRI be put in for her, she states she still cannot walk on her leg and proceeded to cry. Please advise # [phone]

## 2018-02-03 NOTE — Telephone Encounter (Signed)
Patient called needing to get her medical records.  She is currently using a walker due to a broken leg and is trying to get out as little as possible and wanted to know if you could get her medical records ready and then when she comes to get her records she would fill out the medical release form.  [phone]

## 2018-02-03 NOTE — Telephone Encounter (Signed)
Please advise 

## 2018-02-03 NOTE — Telephone Encounter (Signed)
C t tibia better pls calal htx

## 2018-02-04 NOTE — Telephone Encounter (Signed)
IC patient and advised.  [facility].  I also gave her the accounting dept phone number to call about patient financial assistance.

## 2018-02-08 NOTE — Telephone Encounter (Signed)
IC-spoke with patient and advised copy of records are ready to pickup

## 2018-02-15 ENCOUNTER — Ambulatory Visit
Admission: RE | Admit: 2018-02-15 | Discharge: 2018-02-15 | Disposition: A | Discharge status: Home / Self Care | Payer: Self-pay | Source: Ambulatory Visit | Attending: Orthopedic Surgery | Admitting: Orthopedic Surgery

## 2018-02-15 DIAGNOSIS — S82224A Nondisplaced transverse fracture of shaft of right tibia, initial encounter for closed fracture: Secondary | ICD-10-CM

## 2018-03-14 ENCOUNTER — Telehealth (INDEPENDENT_AMBULATORY_CARE_PROVIDER_SITE_OTHER): Payer: Self-pay

## 2018-03-14 NOTE — Telephone Encounter (Signed)
Ok for 4 w  hc placard

## 2018-03-14 NOTE — Telephone Encounter (Signed)
IC advised could pick up at front desk.  

## 2018-03-14 NOTE — Telephone Encounter (Signed)
Patient wanting handicap placard. Ok for this? She was last seen in 08/21 and your note stated   Plan is return to work 02/21/2018 with ibuprofen prescribed.  Follow-up with me as needed.  No calf tenderness today on exam.

## 2018-03-16 ENCOUNTER — Ambulatory Visit (INDEPENDENT_AMBULATORY_CARE_PROVIDER_SITE_OTHER): Payer: Self-pay | Admitting: Orthopedic Surgery

## 2018-03-16 ENCOUNTER — Encounter (INDEPENDENT_AMBULATORY_CARE_PROVIDER_SITE_OTHER): Payer: Self-pay | Admitting: Orthopedic Surgery

## 2018-03-16 ENCOUNTER — Ambulatory Visit (INDEPENDENT_AMBULATORY_CARE_PROVIDER_SITE_OTHER): Payer: Self-pay

## 2018-03-16 DIAGNOSIS — S82224A Nondisplaced transverse fracture of shaft of right tibia, initial encounter for closed fracture: Secondary | ICD-10-CM

## 2018-03-16 NOTE — Progress Notes (Signed)
   Post-Op Visit Note   Patient: Stephanie Hensley           Date of Birth: 1961/01/24           MRN: [id number] Visit Date: 03/16/2018 PCP: Patient, No Pcp Per   Assessment & Plan:  Chief Complaint:  Chief Complaint  Patient presents with  . Right Knee - Follow-up   Visit Diagnoses:  1. Closed nondisplaced transverse fracture of shaft of right tibia, initial encounter     Plan: The patient is here for follow-up of right closed tib-fib fracture.  CT scan from a month ago shows callus formation in the intramedullary canal.  Radiographs today demonstrate increased callus formation compared to plain radiographs from 821.  Plan at this time is to continue weightbearing as tolerated.  She is having some numbness in the right toes.  I think that is either coming from her back neuropathy or potentially from irritation of the nerve from callus formation.  Nonetheless I do not think an intervention is required at this time unless her symptoms persist.  If she is not improving by Thanksgiving I like her to come back for repeat radiographs and evaluation.  Possible nerve conduction study at that time  Follow-Up Instructions: Return if symptoms worsen or fail to improve.   Orders:  Orders Placed This Encounter  Procedures  . XR Tibia/Fibula Right   No orders of the defined types were placed in this encounter.   Imaging: Xr Tibia/fibula Right  Result Date: 03/16/2018 AP lateral right tib-fib reviewed.  Callus formation is present about a transverse tibial shaft fracture about a hand breadth below the tibial tubercle.  Increased callus formation is present.  Alignment normal.  Arthritis is present in the knee worse in the medial compartment   PMFS History: Patient Active Problem List   Diagnosis Date Noted  . Closed 4-part fracture of proximal humerus, left, initial encounter 06/18/2016   Past Medical History:  Diagnosis Date  . Asthma   . Headache    migraines in the past  . PTSD  (post-traumatic stress disorder)     Family History  Problem Relation Age of Onset  . Cancer Mother   . Heart disease Father     Past Surgical History:  Procedure Laterality Date  . CHOLECYSTECTOMY    . HERNIA REPAIR     umbilical hernia repair  . KNEE SURGERY    . SHOULDER HEMI-ARTHROPLASTY Left 06/18/2016   Procedure: LEFT SHOULDER HEMI-ARTHROPLASTY;  Surgeon: Yolonda Kida, MD;  Location: [facility] OR;  Service: Orthopedics;  Laterality: Left;  . TUBAL LIGATION     Social History   Occupational History  . Not on file  Tobacco Use  . Smoking status: Former Smoker    Types: Cigarettes    Last attempt to quit: 06/17/2012    Years since quitting: 5.7  . Smokeless tobacco: Never Used  Substance and Sexual Activity  . Alcohol use: No  . Drug use: No  . Sexual activity: Not on file

## 2018-03-18 ENCOUNTER — Emergency Department (HOSPITAL_COMMUNITY): Payer: No Typology Code available for payment source

## 2018-03-18 ENCOUNTER — Other Ambulatory Visit: Payer: Self-pay

## 2018-03-18 ENCOUNTER — Encounter (HOSPITAL_COMMUNITY): Payer: Self-pay | Admitting: Emergency Medicine

## 2018-03-18 ENCOUNTER — Emergency Department (HOSPITAL_COMMUNITY)
Admission: EM | Admit: 2018-03-18 | Discharge: 2018-03-18 | Disposition: A | Discharge status: Home / Self Care | Payer: No Typology Code available for payment source | Attending: Emergency Medicine | Admitting: Emergency Medicine

## 2018-03-18 DIAGNOSIS — Z87891 Personal history of nicotine dependence: Secondary | ICD-10-CM | POA: Insufficient documentation

## 2018-03-18 DIAGNOSIS — Y929 Unspecified place or not applicable: Secondary | ICD-10-CM | POA: Diagnosis not present

## 2018-03-18 DIAGNOSIS — Y999 Unspecified external cause status: Secondary | ICD-10-CM | POA: Diagnosis not present

## 2018-03-18 DIAGNOSIS — Y939 Activity, unspecified: Secondary | ICD-10-CM | POA: Diagnosis not present

## 2018-03-18 DIAGNOSIS — Z96612 Presence of left artificial shoulder joint: Secondary | ICD-10-CM | POA: Diagnosis not present

## 2018-03-18 DIAGNOSIS — J45909 Unspecified asthma, uncomplicated: Secondary | ICD-10-CM | POA: Diagnosis not present

## 2018-03-18 DIAGNOSIS — Z79899 Other long term (current) drug therapy: Secondary | ICD-10-CM | POA: Diagnosis not present

## 2018-03-18 DIAGNOSIS — M545 Low back pain, unspecified: Secondary | ICD-10-CM

## 2018-03-18 MED ORDER — ACETAMINOPHEN 325 MG PO TABS
650.0000 mg | ORAL_TABLET | Freq: Once | ORAL | Status: AC
  Administered 2018-03-18: 650 mg via ORAL
  Filled 2018-03-18: qty 2

## 2018-03-18 MED ORDER — METHOCARBAMOL 500 MG PO TABS: 500.0000 mg | ORAL_TABLET | Freq: Two times a day (BID) | ORAL | 0 refills | Status: DC

## 2018-03-18 MED ORDER — NAPROXEN 500 MG PO TABS: 500.0000 mg | ORAL_TABLET | Freq: Two times a day (BID) | ORAL | 0 refills | Status: DC
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
## 2018-03-18 NOTE — ED Notes (Signed)
Patient given discharge teaching and verbalized understanding. Patient ambulated out of ED with a walker.
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
## 2018-05-11 ENCOUNTER — Encounter (INDEPENDENT_AMBULATORY_CARE_PROVIDER_SITE_OTHER): Payer: Self-pay | Admitting: Orthopedic Surgery

## 2018-05-11 ENCOUNTER — Ambulatory Visit (INDEPENDENT_AMBULATORY_CARE_PROVIDER_SITE_OTHER): Payer: Self-pay

## 2018-05-11 ENCOUNTER — Telehealth (INDEPENDENT_AMBULATORY_CARE_PROVIDER_SITE_OTHER): Payer: Self-pay | Admitting: Orthopedic Surgery

## 2018-05-11 ENCOUNTER — Other Ambulatory Visit (INDEPENDENT_AMBULATORY_CARE_PROVIDER_SITE_OTHER): Payer: Self-pay

## 2018-05-11 ENCOUNTER — Ambulatory Visit (INDEPENDENT_AMBULATORY_CARE_PROVIDER_SITE_OTHER): Payer: Self-pay | Admitting: Orthopedic Surgery

## 2018-05-11 DIAGNOSIS — S82224A Nondisplaced transverse fracture of shaft of right tibia, initial encounter for closed fracture: Secondary | ICD-10-CM

## 2018-05-11 MED ORDER — METHOCARBAMOL 500 MG PO TABS: 500.0000 mg | ORAL_TABLET | Freq: Two times a day (BID) | ORAL | 2 refills | Status: DC | PRN
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
## 2018-05-11 NOTE — Telephone Encounter (Signed)
Resent
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
## 2018-07-13 ENCOUNTER — Ambulatory Visit (INDEPENDENT_AMBULATORY_CARE_PROVIDER_SITE_OTHER): Payer: Self-pay | Admitting: Orthopedic Surgery

## 2018-11-22 ENCOUNTER — Emergency Department (HOSPITAL_COMMUNITY): Payer: No Typology Code available for payment source

## 2018-11-22 ENCOUNTER — Encounter (HOSPITAL_COMMUNITY): Payer: Self-pay | Admitting: Emergency Medicine

## 2018-11-22 ENCOUNTER — Other Ambulatory Visit: Payer: Self-pay

## 2018-11-22 ENCOUNTER — Emergency Department (HOSPITAL_COMMUNITY)
Admission: EM | Admit: 2018-11-22 | Discharge: 2018-11-22 | Disposition: A | Discharge status: Home / Self Care | Payer: No Typology Code available for payment source | Attending: Emergency Medicine | Admitting: Emergency Medicine

## 2018-11-22 DIAGNOSIS — J45909 Unspecified asthma, uncomplicated: Secondary | ICD-10-CM | POA: Diagnosis not present

## 2018-11-22 DIAGNOSIS — S301XXA Contusion of abdominal wall, initial encounter: Secondary | ICD-10-CM | POA: Diagnosis not present

## 2018-11-22 DIAGNOSIS — Z87891 Personal history of nicotine dependence: Secondary | ICD-10-CM | POA: Insufficient documentation

## 2018-11-22 DIAGNOSIS — Z79899 Other long term (current) drug therapy: Secondary | ICD-10-CM | POA: Insufficient documentation

## 2018-11-22 DIAGNOSIS — Y92410 Unspecified street and highway as the place of occurrence of the external cause: Secondary | ICD-10-CM | POA: Insufficient documentation

## 2018-11-22 DIAGNOSIS — Y999 Unspecified external cause status: Secondary | ICD-10-CM | POA: Diagnosis not present

## 2018-11-22 DIAGNOSIS — Y939 Activity, unspecified: Secondary | ICD-10-CM | POA: Insufficient documentation

## 2018-11-22 DIAGNOSIS — S3991XA Unspecified injury of abdomen, initial encounter: Secondary | ICD-10-CM | POA: Diagnosis present

## 2018-11-22 LAB — CBC WITH DIFFERENTIAL/PLATELET
Abs Immature Granulocytes: 0.04 K/uL (ref 0.00–0.07)
Basophils Absolute: 0.1 K/uL (ref 0.0–0.1)
Basophils Relative: 1 %
Eosinophils Absolute: 0.2 K/uL (ref 0.0–0.5)
Eosinophils Relative: 2 %
HCT: 40.4 % (ref 36.0–46.0)
Hemoglobin: 12.2 g/dL (ref 12.0–15.0)
Immature Granulocytes: 1 %
Lymphocytes Relative: 27 %
Lymphs Abs: 1.9 K/uL (ref 0.7–4.0)
MCH: 28.2 pg (ref 26.0–34.0)
MCHC: 30.2 g/dL (ref 30.0–36.0)
MCV: 93.5 fL (ref 80.0–100.0)
Monocytes Absolute: 0.4 K/uL (ref 0.1–1.0)
Monocytes Relative: 5 %
Neutro Abs: 4.7 K/uL (ref 1.7–7.7)
Neutrophils Relative %: 64 %
Platelets: 258 K/uL (ref 150–400)
RBC: 4.32 MIL/uL (ref 3.87–5.11)
RDW: 13.2 % (ref 11.5–15.5)
WBC: 7.2 K/uL (ref 4.0–10.5)
nRBC: 0 % (ref 0.0–0.2)

## 2018-11-22 LAB — BASIC METABOLIC PANEL
Anion gap: 11 (ref 5–15)
BUN: 18 mg/dL (ref 6–20)
CO2: 23 mmol/L (ref 22–32)
Calcium: 9.2 mg/dL (ref 8.9–10.3)
Chloride: 105 mmol/L (ref 98–111)
Creatinine, Ser: 0.8 mg/dL (ref 0.44–1.00)
GFR calc Af Amer: >60 mL/min (ref >60)
GFR calc non Af Amer: >60 mL/min (ref >60)
Glucose, Bld: 98 mg/dL (ref 70–99)
Potassium: 4.3 mmol/L (ref 3.5–5.1)
Sodium: 139 mmol/L (ref 135–145)

## 2018-11-22 MED ORDER — ALBUTEROL SULFATE HFA 108 (90 BASE) MCG/ACT IN AERS: 1.0000 | INHALATION_SPRAY | Freq: Four times a day (QID) | RESPIRATORY_TRACT | 0 refills | Status: DC | PRN

## 2018-11-22 MED ORDER — SODIUM CHLORIDE (PF) 0.9 % IJ SOLN
INTRAMUSCULAR | Status: AC
  Filled 2018-11-22: qty 50

## 2018-11-22 MED ORDER — TRAMADOL HCL 50 MG PO TABS
50.0000 mg | ORAL_TABLET | Freq: Once | ORAL | Status: AC
  Administered 2018-11-22: 50 mg via ORAL
  Filled 2018-11-22: qty 1

## 2018-11-22 MED ORDER — IOHEXOL 300 MG/ML  SOLN
100.0000 mL | Freq: Once | INTRAMUSCULAR | Status: AC | PRN
  Administered 2018-11-22: 100 mL via INTRAVENOUS
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
## 2018-11-22 NOTE — ED Notes (Signed)
Lab called for recollect on lt green tube

## 2018-11-22 NOTE — TOC Transition Note (Signed)
Transition of Care [facility]) - CM/SW Discharge Note   Patient Details  Name: Stephanie Hensley MRN: [id number] Date of Birth: 10-06-1960  [facility] CM/SW Contact:  Fuller Mandril, RN Phone Number: 11/22/2018, 2:22 PM   Clinical Narrative:     [facility] consulted regarding helping pt set up PCP appointment.  Final next level of care: Home/Self Care Barriers to Discharge: Continued Medical Work up(PCP appointment)   Patient Goals and CMS Choice        Discharge Placement                       Discharge Plan and Services                  Emryn Flanery J. Clydene Laming, RN, BSN, [employer] [phone]  [facility] set up appointment at [place].  Spoke with pt at bedside and advised to please arrive 15 min early and take a picture ID and your current medications.  Pt verbalizes understanding of keeping appointment.                     Social Determinants of Health (SDOH) Interventions     Readmission Risk Interventions No flowsheet data found.
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
## 2018-11-22 NOTE — ED Notes (Signed)
Bed: WA25 Expected date:  Expected time:  Means of arrival:  Comments: 

## 2018-11-22 NOTE — Discharge Instructions (Addendum)
Take NSAIDs or Tylenol as needed for the next week. Take this medicine with food. Take muscle relaxer at bedtime to help you sleep. This medicine makes you drowsy so do not take before driving or work Use a heating pad for sore muscles - use for 20 minutes several times a day Return for worsening symptoms Please follow up with [facility] on 6/23 at 8:45am

## 2018-11-29 ENCOUNTER — Inpatient Hospital Stay: Payer: Self-pay | Admitting: Primary Care

## 2018-12-28 ENCOUNTER — Ambulatory Visit: Payer: Self-pay | Admitting: Orthopedic Surgery

## 2019-01-02 ENCOUNTER — Other Ambulatory Visit: Payer: Self-pay

## 2019-01-02 ENCOUNTER — Ambulatory Visit (INDEPENDENT_AMBULATORY_CARE_PROVIDER_SITE_OTHER): Payer: Self-pay

## 2019-01-02 ENCOUNTER — Ambulatory Visit (INDEPENDENT_AMBULATORY_CARE_PROVIDER_SITE_OTHER): Payer: Self-pay | Admitting: Orthopedic Surgery

## 2019-01-02 ENCOUNTER — Encounter: Payer: Self-pay | Admitting: Orthopedic Surgery

## 2019-01-02 DIAGNOSIS — M79604 Pain in right leg: Secondary | ICD-10-CM

## 2019-01-02 MED ORDER — TRAMADOL HCL 50 MG PO TABS: ORAL_TABLET | ORAL | 0 refills | Status: DC

## 2019-01-02 MED ORDER — TRAMADOL HCL 50 MG PO TABS: 50.0000 mg | ORAL_TABLET | Freq: Four times a day (QID) | ORAL | 0 refills | Status: DC | PRN
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
## 2019-01-03 ENCOUNTER — Telehealth: Payer: Self-pay

## 2019-01-03 NOTE — Telephone Encounter (Signed)
Records faxed [phone]

## 2019-01-03 NOTE — Telephone Encounter (Signed)
U roc thx

## 2019-01-03 NOTE — Telephone Encounter (Signed)
Dr Marlou Sa patient has appt 08/05 @1 :15pm with Dr Marcelino Scot as requested.   Tammy, can you please fax Dr Forbes Cellar office notes? Thanks.

## 2019-01-17 ENCOUNTER — Inpatient Hospital Stay: Admit: 2019-01-17 | Payer: No Typology Code available for payment source | Admitting: Orthopedic Surgery

## 2019-01-17 SURGERY — INSERTION, INTRAMEDULLARY ROD, FIBULA
Anesthesia: General | Laterality: Left

## 2019-02-01 ENCOUNTER — Telehealth: Payer: Self-pay | Admitting: Orthopedic Surgery

## 2019-02-01 NOTE — Telephone Encounter (Signed)
Patient called stated she needs copy of records, will come by office tomorrow to sign release form and to pick up records. She states she is not driving or walking.I told her we could bring release form out to her or her driver can come inside and take it to her to complete.

## 2019-05-22 ENCOUNTER — Telehealth: Payer: Self-pay | Admitting: Orthopedic Surgery

## 2019-05-22 NOTE — Telephone Encounter (Signed)
Patient called needing to get records & xrays to take to [facility]. She wants to get as soon as possible as they need her records before they will schedule appt. I advised her need to sign release form. That I would have her records & xrys ready so she will only make one trip out to our office.

## 2019-06-09 HISTORY — PX: OTHER SURGICAL HISTORY: SHX169

## 2019-07-06 DIAGNOSIS — S82201K Unspecified fracture of shaft of right tibia, subsequent encounter for closed fracture with nonunion: Secondary | ICD-10-CM | POA: Insufficient documentation

## 2020-03-10 LAB — HM PAP SMEAR

## 2020-04-19 DIAGNOSIS — H2511 Age-related nuclear cataract, right eye: Secondary | ICD-10-CM | POA: Insufficient documentation

## 2020-04-19 DIAGNOSIS — H26111 Localized traumatic opacities, right eye: Secondary | ICD-10-CM | POA: Insufficient documentation

## 2020-04-19 DIAGNOSIS — F32A Depression, unspecified: Secondary | ICD-10-CM | POA: Insufficient documentation

## 2020-04-19 DIAGNOSIS — H35371 Puckering of macula, right eye: Secondary | ICD-10-CM | POA: Insufficient documentation

## 2020-04-19 DIAGNOSIS — J45909 Unspecified asthma, uncomplicated: Secondary | ICD-10-CM | POA: Insufficient documentation

## 2020-04-19 DIAGNOSIS — F4310 Post-traumatic stress disorder, unspecified: Secondary | ICD-10-CM | POA: Insufficient documentation

## 2020-05-10 DIAGNOSIS — B372 Candidiasis of skin and nail: Secondary | ICD-10-CM | POA: Insufficient documentation

## 2020-05-29 IMAGING — CT CT TIBIA FIBULA *R* W/O CM
2 of 3 series · 10 of 29 positions shown, 12 images · non-contrast
Comparison: Plain films right lower leg 12/22/2017 and 01/26/2018.

CLINICAL DATA: Right lower leg pain and swelling. History of a
transverse fracture of the proximal diaphysis of the tibia diagnosed
12/22/2017. No known injury.

EXAM:
CT OF THE LOWER RIGHT EXTREMITY WITHOUT CONTRAST
TECHNIQUE: Multidetector CT imaging of the right lower extremity was performed
according to the standard protocol.

[Series 4: soft tissue lower extremity · axial · 0.46mm/px · z∈[-433,-103]mm · 5 of 239 slices shown, 7 images]
[im 37/239  soft-tissue]
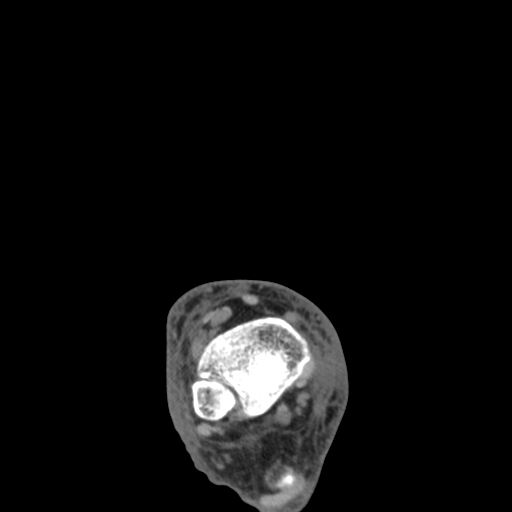
[im 37/239  bone]
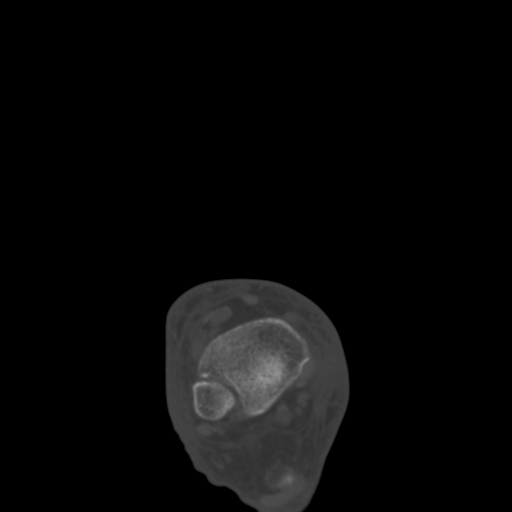
[im 74/239  bone]
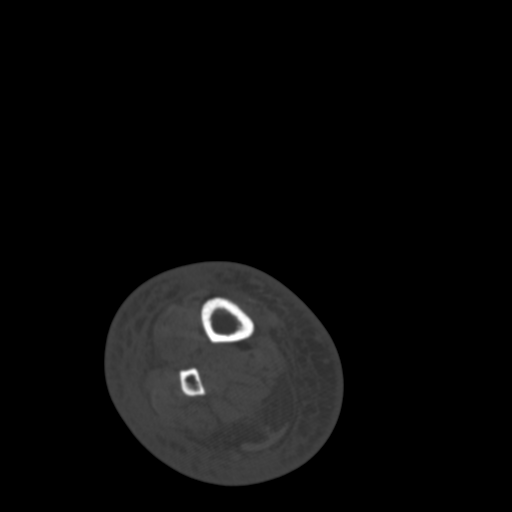
[im 129/239  bone]
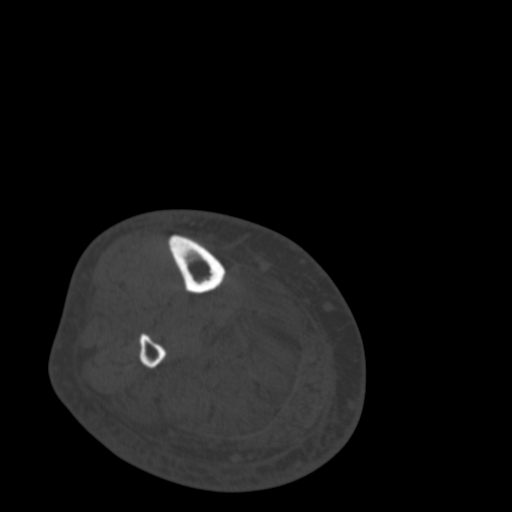
[im 165/239  bone]
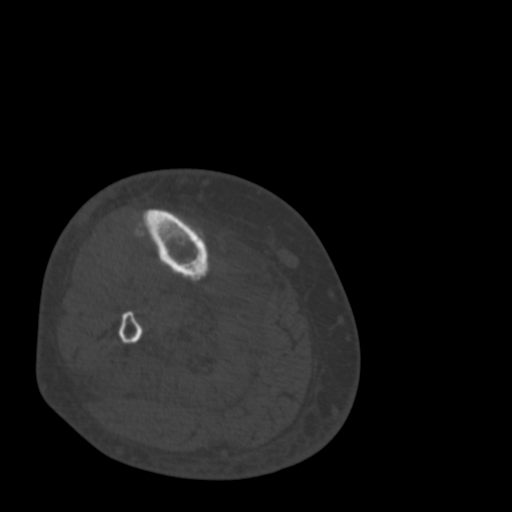
[im 202/239  soft-tissue]
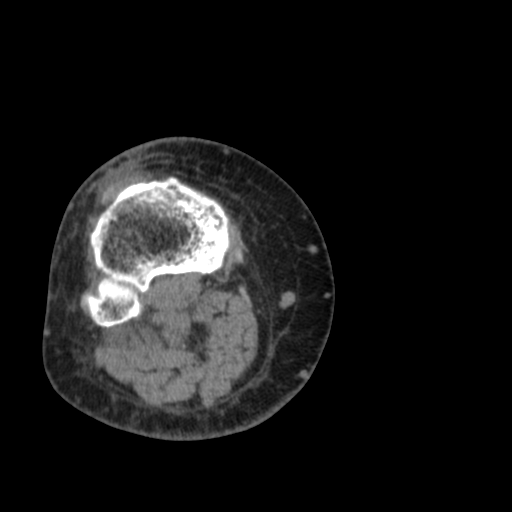
[im 202/239  bone]
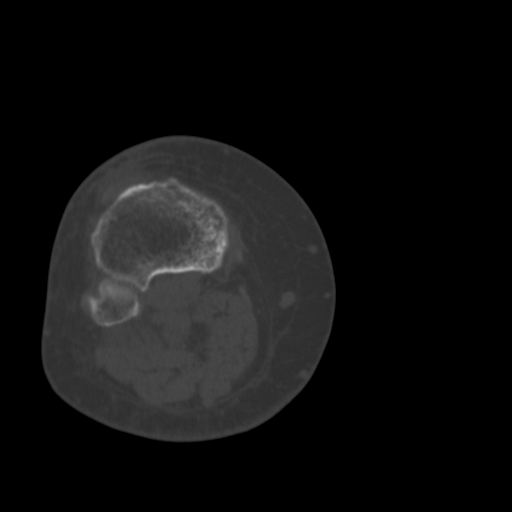

[Series 8: sag bone · sagittal · 0.39mm/px · 5 of 81 slices shown]
[im 17/81  bone]
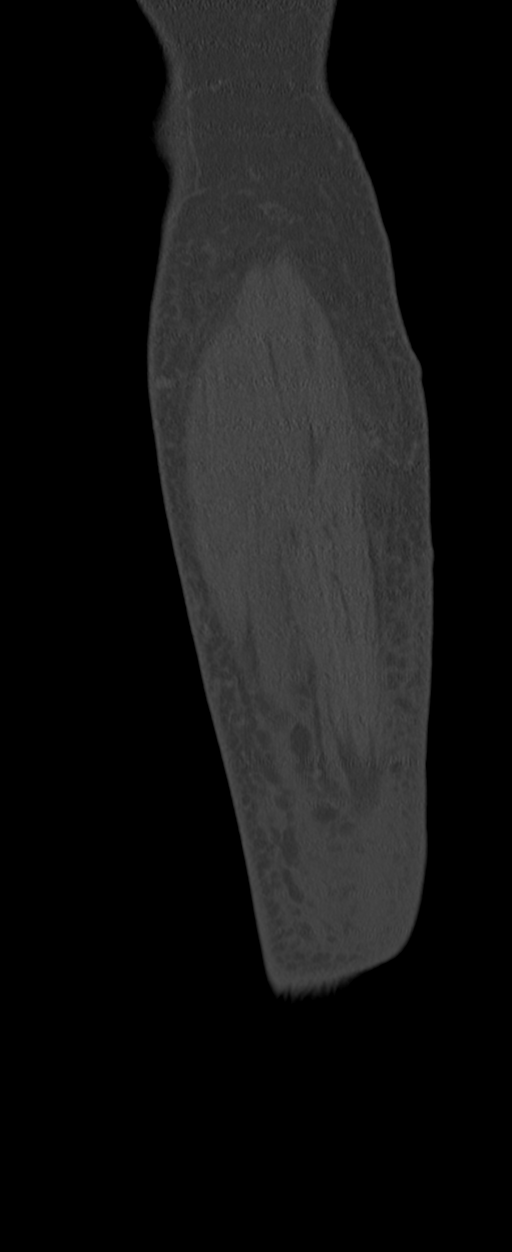
[im 25/81  soft-tissue]
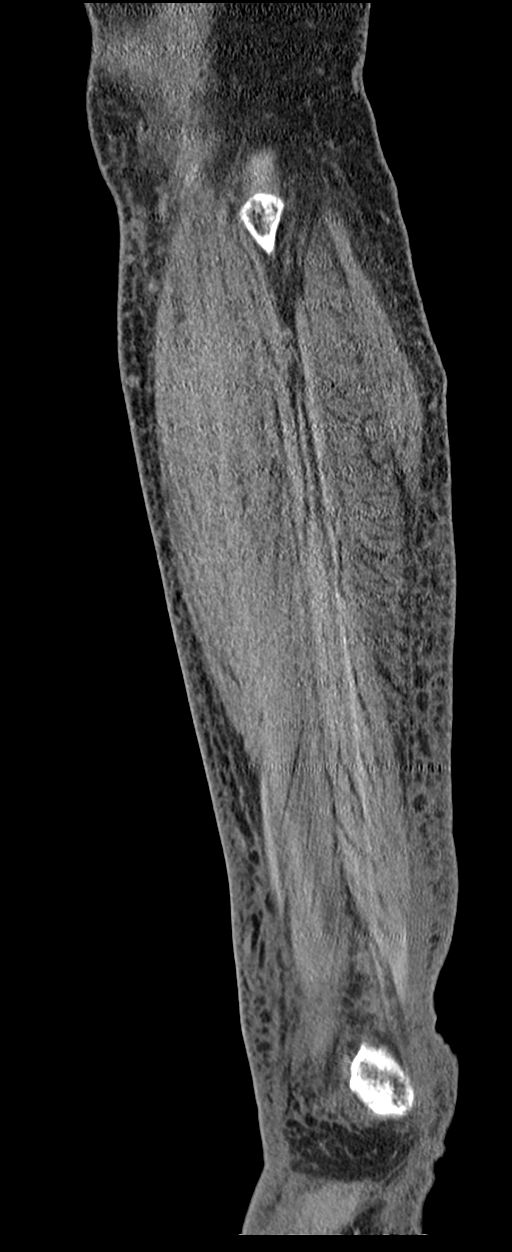
[im 33/81  bone]
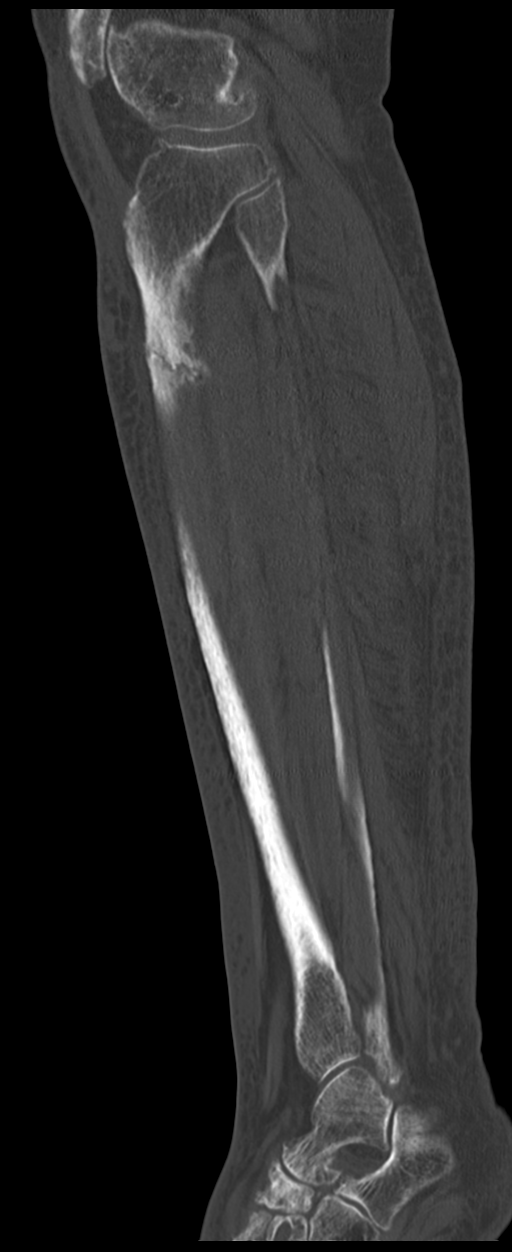
[im 49/81  bone]
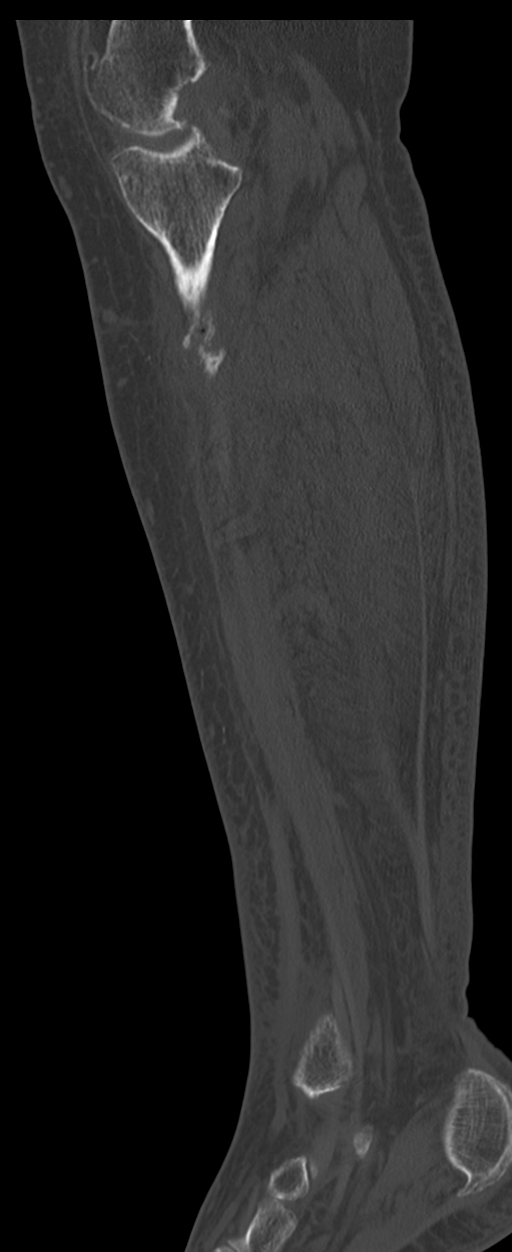
[im 65/81  bone]
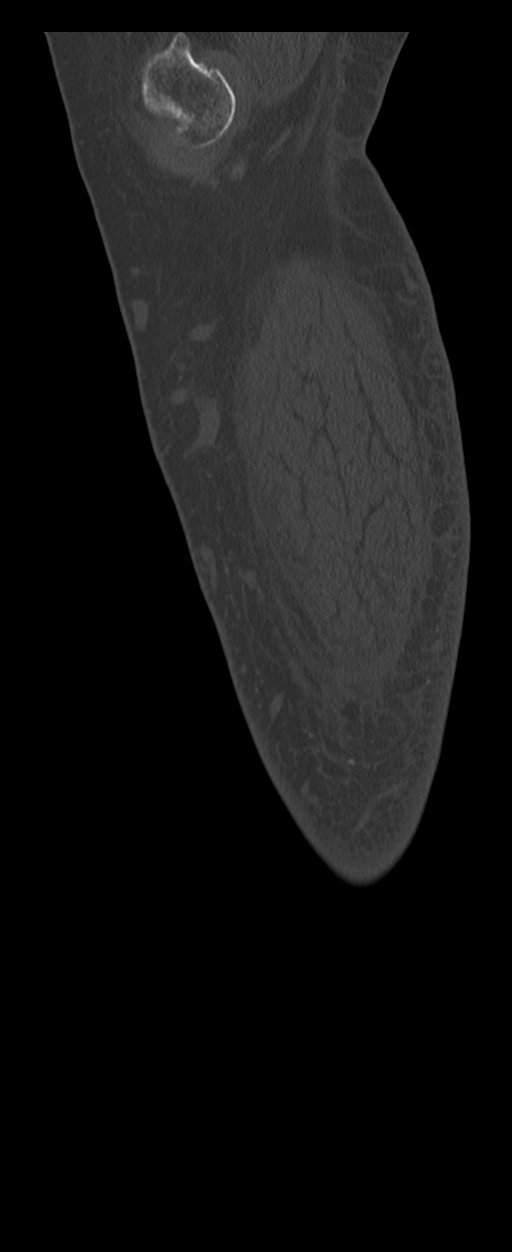

[10 of 29 positions shown; findings below may reference images not displayed]

FINDINGS: Bones/Joint/Cartilage

Nondisplaced transverse fracture of the proximal diaphysis of the
tibia demonstrates progressive healing. There is callus formation
diffusely about the fracture. Fairly extensive bridging bone is seen
across the medullary space at the fracture site. No acute fracture
is identified.

Bones are osteopenic. The patient has age advanced osteoarthritis
about the right knee which is worst in the medial and patellofemoral
compartments.

Ligaments

Suboptimally assessed by CT.

Muscles and Tendons

Intact. There is some atrophy of the soleus and gastrocnemius
musculature presumably related to disuse.

Soft tissues

Subcutaneous edema is present about the lower leg.
IMPRESSION: Nondisplaced transverse fracture proximal diaphysis of the right
tibia demonstrates progressive healing with callus formation present
about the fracture and bridging bone across the medullary space
identified.

Osteopenia.

Age advanced osteoarthritis about the right knee is worst in the
patellofemoral and medial compartments.

Atrophy of the gastrocnemius and soleus muscles is presumably
related to disuse.

## 2021-04-08 LAB — HM MAMMOGRAPHY

## 2021-07-18 ENCOUNTER — Ambulatory Visit (INDEPENDENT_AMBULATORY_CARE_PROVIDER_SITE_OTHER): Payer: Medicare (Managed Care) | Admitting: Family

## 2021-07-18 ENCOUNTER — Other Ambulatory Visit (HOSPITAL_COMMUNITY)
Admission: RE | Admit: 2021-07-18 | Discharge: 2021-07-18 | Disposition: A | Discharge status: Home / Self Care | Payer: Medicare (Managed Care) | Source: Ambulatory Visit | Attending: Family | Admitting: Family

## 2021-07-18 ENCOUNTER — Encounter: Payer: Self-pay | Admitting: Family

## 2021-07-18 VITALS — BP 122/80 | HR 78 | Temp 98.4°F | Resp 18 | Ht 65.0 in | Wt 290.6 lb

## 2021-07-18 DIAGNOSIS — Z13220 Encounter for screening for lipoid disorders: Secondary | ICD-10-CM

## 2021-07-18 DIAGNOSIS — Z1211 Encounter for screening for malignant neoplasm of colon: Secondary | ICD-10-CM

## 2021-07-18 DIAGNOSIS — Z0000 Encounter for general adult medical examination without abnormal findings: Secondary | ICD-10-CM | POA: Diagnosis not present

## 2021-07-18 DIAGNOSIS — Z1151 Encounter for screening for human papillomavirus (HPV): Secondary | ICD-10-CM | POA: Insufficient documentation

## 2021-07-18 DIAGNOSIS — Z124 Encounter for screening for malignant neoplasm of cervix: Secondary | ICD-10-CM | POA: Diagnosis not present

## 2021-07-18 DIAGNOSIS — Z1159 Encounter for screening for other viral diseases: Secondary | ICD-10-CM | POA: Diagnosis not present

## 2021-07-18 DIAGNOSIS — Z01419 Encounter for gynecological examination (general) (routine) without abnormal findings: Secondary | ICD-10-CM | POA: Insufficient documentation

## 2021-07-18 DIAGNOSIS — R7303 Prediabetes: Secondary | ICD-10-CM | POA: Diagnosis not present

## 2021-07-18 MED ORDER — ALBUTEROL SULFATE HFA 108 (90 BASE) MCG/ACT IN AERS: 1.0000 | INHALATION_SPRAY | Freq: Four times a day (QID) | RESPIRATORY_TRACT | 1 refills | Status: DC | PRN

## 2021-07-18 MED ORDER — HYDROCHLOROTHIAZIDE 12.5 MG PO CAPS: 12.5000 mg | ORAL_CAPSULE | Freq: Every day | ORAL | 3 refills | Status: DC

## 2021-07-18 NOTE — Progress Notes (Signed)
Stephanie Hensley is a 61 y.o. female with the following history as recorded in EpicCare:  Patient Active Problem List   Diagnosis Date Noted   Closed 4-part fracture of proximal humerus, left, initial encounter 06/18/2016    Current Outpatient Medications  Medication Sig Dispense Refill   acetaminophen (TYLENOL) 500 MG tablet Take 500 mg by mouth every 6 (six) hours as needed.     Calcium-Magnesium-Vitamin D (CITRACAL SLOW RELEASE PO) Take by mouth.     cetirizine (ZYRTEC) 10 MG tablet Take 10 mg by mouth daily.     diclofenac (VOLTAREN) 75 MG EC tablet Take 75 mg by mouth 2 (two) times daily.     Ferrous Sulfate (SLOW RELEASE IRON PO) Take by mouth once a week.     Magnesium 250 MG TABS Take by mouth.     Multiple Vitamins-Minerals (ALIVE WOMENS 50+) CHEW Chew by mouth.     TURMERIC-GINGER PO Take by mouth.     UNABLE TO FIND Take 1,000 mg by mouth daily. Med Name: Milk Tistle     albuterol (VENTOLIN HFA) 108 (90 Base) MCG/ACT inhaler Inhale 1-2 puffs into the lungs every 6 (six) hours as needed for wheezing or shortness of breath. 1 each 1   hydrochlorothiazide (MICROZIDE) 12.5 MG capsule Take 1 capsule (12.5 mg total) by mouth daily. 90 capsule 3   No current facility-administered medications for this visit.    Allergies: Patient has no known allergies.  Past Medical History:  Diagnosis Date   Asthma    Headache    migraines in the past   PTSD (post-traumatic stress disorder)     Past Surgical History:  Procedure Laterality Date   CHOLECYSTECTOMY     HERNIA REPAIR     umbilical hernia repair   KNEE SURGERY     SHOULDER HEMI-ARTHROPLASTY Left 06/18/2016   Procedure: LEFT SHOULDER HEMI-ARTHROPLASTY;  Surgeon: Nicholes Stairs, MD;  Location: [facility];  Service: Orthopedics;  Laterality: Left;   TUBAL LIGATION      Family History  Problem Relation Age of Onset   Alcohol abuse Mother    Cancer Mother    Alcohol abuse Father    Heart disease Father    COPD Sister     Arthritis Sister    Alcohol abuse Sister    Mental illness Sister    Heart disease Brother     Social History   Tobacco Use   Smoking status: Former    Types: Cigarettes    Quit date: 06/17/2012    Years since quitting: 9.0   Smokeless tobacco: Never  Substance Use Topics   Alcohol use: No    Subjective:  Presents today as a new patient for CPE; has been able to get insurance in the past year and needs to get labs/ pap smear/ refills updated; Planning to establish with ophthalmology for cataract evaluation;  Needs refills on albuterol and HCTZ;  Up to date on mammogram; would like Cologuard as opposed to colonoscopy;   Review of Systems  Constitutional: Negative.   HENT: Negative.    Eyes: Negative.   Respiratory: Negative.    Cardiovascular: Negative.   Gastrointestinal: Negative.   Genitourinary: Negative.   Musculoskeletal:  Positive for joint pain.  Skin: Negative.   Neurological: Negative.   Endo/Heme/Allergies: Negative.   Psychiatric/Behavioral: Negative.        Objective:  Vitals:   07/18/21 1338  BP: 122/80  Pulse: 78  Resp: 18  Temp: 98.4 F (36.9 C)  TempSrc:  Oral  SpO2: 98%  Weight: 290 lb 9.6 oz (131.8 kg)  Height: _0  (1.651 m)    General: Well developed, well nourished, in no acute distress  Skin : Warm and dry.  Head: Normocephalic and atraumatic  Eyes: Sclera and conjunctiva clear; pupils round and reactive to light; extraocular movements intact  Ears: External normal; canals clear; tympanic membranes normal  Oropharynx: Pink, supple. No suspicious lesions  Neck: Supple without thyromegaly, adenopathy  Lungs: Respirations unlabored; clear to auscultation bilaterally without wheeze, rales, rhonchi  CVS exam: normal rate and regular rhythm.  Abdomen: Soft; nontender; nondistended; normoactive bowel sounds; no masses or hepatosplenomegaly  Musculoskeletal: No deformities; no active joint inflammation  Extremities: No edema, cyanosis,  clubbing  Vessels: Symmetric bilaterally  Neurologic: Alert and oriented; speech intact; face symmetrical; moves all extremities well; CNII-XII intact without focal deficit  Pelvic exam: normal external genitalia, vulva, vagina, cervix, uterus and adnexa. Difficult to assess due to body habitus;  Assessment:  1. PE (physical exam), annual   2. Colon cancer screening   3. Lipid screening   4. Pre-diabetes   5. Need for hepatitis C screening test   6. Cervical cancer screening     Plan:  Age appropriate preventive healthcare needs addressed; encouraged regular eye doctor and dental exams; encouraged regular exercise; will update refills as needed today; follow-up to be determined; Order updated for Cologuard; Thin Prep pap collected;  She will return for fasting labs;  This visit occurred during the SARS-CoV-2 public health emergency.  Safety protocols were in place, including screening questions prior to the visit, additional usage of staff PPE, and extensive cleaning of exam room while observing appropriate contact time as indicated for disinfecting solutions.     No follow-ups on file.  Orders Placed This Encounter  Procedures   Cologuard   CBC with Differential/Platelet    Standing Status:   Future    Standing Expiration Date:   07/18/2022   Comp Met (CMET)    Standing Status:   Future    Standing Expiration Date:   07/18/2022   Lipid panel    Standing Status:   Future    Standing Expiration Date:   07/18/2022   Hemoglobin A1c    Standing Status:   Future    Standing Expiration Date:   07/18/2022   Hepatitis C Antibody    Standing Status:   Future    Standing Expiration Date:   07/18/2022    Requested Prescriptions   Signed Prescriptions Disp Refills   hydrochlorothiazide (MICROZIDE) 12.5 MG capsule 90 capsule 3    Sig: Take 1 capsule (12.5 mg total) by mouth daily.   albuterol (VENTOLIN HFA) 108 (90 Base) MCG/ACT inhaler 1 each 1    Sig: Inhale 1-2 puffs into the lungs  every 6 (six) hours as needed for wheezing or shortness of breath.

## 2021-07-23 ENCOUNTER — Other Ambulatory Visit: Payer: Medicare (Managed Care)

## 2021-07-24 ENCOUNTER — Other Ambulatory Visit: Payer: Self-pay | Admitting: Family

## 2021-07-24 DIAGNOSIS — R87619 Unspecified abnormal cytological findings in specimens from cervix uteri: Secondary | ICD-10-CM

## 2021-07-24 LAB — CYTOLOGY - PAP
Comment: NEGATIVE
Comment: NEGATIVE
Diagnosis: UNDETERMINED — AB
HPV 16: NEGATIVE
HPV 18 / 45: NEGATIVE
High risk HPV: POSITIVE — AB

## 2021-07-25 ENCOUNTER — Telehealth: Payer: Self-pay

## 2021-07-25 NOTE — Telephone Encounter (Signed)
Stephanie Hensley, CMA spoke with pt during her lab visit and gave pt information regarding her referral.

## 2021-07-28 ENCOUNTER — Other Ambulatory Visit (INDEPENDENT_AMBULATORY_CARE_PROVIDER_SITE_OTHER): Payer: Medicare (Managed Care)

## 2021-07-28 DIAGNOSIS — Z0000 Encounter for general adult medical examination without abnormal findings: Secondary | ICD-10-CM | POA: Diagnosis not present

## 2021-07-28 DIAGNOSIS — Z13220 Encounter for screening for lipoid disorders: Secondary | ICD-10-CM | POA: Diagnosis not present

## 2021-07-28 DIAGNOSIS — Z1159 Encounter for screening for other viral diseases: Secondary | ICD-10-CM | POA: Diagnosis not present

## 2021-07-28 DIAGNOSIS — R7303 Prediabetes: Secondary | ICD-10-CM

## 2021-07-28 LAB — LIPID PANEL
Cholesterol: 228 mg/dL — ABNORMAL HIGH (ref 0–200)
HDL: 50.2 mg/dL (ref >39.00)
LDL Cholesterol: 147 mg/dL — ABNORMAL HIGH (ref 0–99)
NonHDL: 177.81
Total CHOL/HDL Ratio: 5
Triglycerides: 155 mg/dL — ABNORMAL HIGH (ref 0.0–149.0)
VLDL: 31 mg/dL (ref 0.0–40.0)

## 2021-07-28 LAB — CBC WITH DIFFERENTIAL/PLATELET
Basophils Absolute: 0.1 10*3/uL (ref 0.0–0.1)
Basophils Relative: 0.9 % (ref 0.0–3.0)
Eosinophils Absolute: 0.2 10*3/uL (ref 0.0–0.7)
Eosinophils Relative: 2.2 % (ref 0.0–5.0)
HCT: 37.2 % (ref 36.0–46.0)
Hemoglobin: 12.4 g/dL (ref 12.0–15.0)
Lymphocytes Relative: 31.3 % (ref 12.0–46.0)
Lymphs Abs: 2.3 10*3/uL (ref 0.7–4.0)
MCHC: 33.3 g/dL (ref 30.0–36.0)
MCV: 88 fl (ref 78.0–100.0)
Monocytes Absolute: 0.4 10*3/uL (ref 0.1–1.0)
Monocytes Relative: 5.4 % (ref 3.0–12.0)
Neutro Abs: 4.4 10*3/uL (ref 1.4–7.7)
Neutrophils Relative %: 60.2 % (ref 43.0–77.0)
Platelets: 261 10*3/uL (ref 150.0–400.0)
RBC: 4.22 Mil/uL (ref 3.87–5.11)
RDW: 14 % (ref 11.5–15.5)
WBC: 7.3 10*3/uL (ref 4.0–10.5)

## 2021-07-28 LAB — COMPREHENSIVE METABOLIC PANEL
ALT: 10 U/L (ref 0–35)
AST: 28 U/L (ref 0–37)
Albumin: 4.3 g/dL (ref 3.5–5.2)
Alkaline Phosphatase: 89 U/L (ref 39–117)
BUN: 22 mg/dL (ref 6–23)
CO2: 30 mEq/L (ref 19–32)
Calcium: 9.8 mg/dL (ref 8.4–10.5)
Chloride: 102 mEq/L (ref 96–112)
Creatinine, Ser: 1.1 mg/dL (ref 0.40–1.20)
GFR: 55.39 mL/min — ABNORMAL LOW (ref >60.00)
Glucose, Bld: 109 mg/dL — ABNORMAL HIGH (ref 70–99)
Potassium: 4.3 mEq/L (ref 3.5–5.1)
Sodium: 137 mEq/L (ref 135–145)
Total Bilirubin: 0.6 mg/dL (ref 0.2–1.2)
Total Protein: 7.5 g/dL (ref 6.0–8.3)

## 2021-07-28 LAB — HEMOGLOBIN A1C: Hgb A1c MFr Bld: 6 % (ref 4.6–6.5)

## 2021-07-29 ENCOUNTER — Encounter: Payer: Self-pay | Admitting: Family

## 2021-07-29 ENCOUNTER — Other Ambulatory Visit: Payer: Self-pay | Admitting: Family

## 2021-07-29 DIAGNOSIS — Z8249 Family history of ischemic heart disease and other diseases of the circulatory system: Secondary | ICD-10-CM

## 2021-07-29 LAB — HEPATITIS C ANTIBODY
Hepatitis C Ab: NONREACTIVE
SIGNAL TO CUT-OFF: <0.02 (ref <1.00)

## 2021-07-31 ENCOUNTER — Other Ambulatory Visit: Payer: Self-pay | Admitting: Family

## 2021-07-31 MED ORDER — DICLOFENAC SODIUM 75 MG PO TBEC: 75.0000 mg | DELAYED_RELEASE_TABLET | Freq: Two times a day (BID) | ORAL | 1 refills | Status: DC

## 2021-08-19 ENCOUNTER — Other Ambulatory Visit: Payer: Self-pay | Admitting: Family

## 2021-08-25 ENCOUNTER — Encounter: Payer: Self-pay | Admitting: Cardiology

## 2021-08-26 ENCOUNTER — Ambulatory Visit: Payer: Medicare (Managed Care) | Admitting: Cardiology

## 2021-09-01 NOTE — Progress Notes (Signed)
? ? ?Referring-Laura Scheryl Darter, FNP ?Reason for referral-hyperlipidemia ? ?HPI: 61 year old female for evaluation of hyperlipidemia at request of Marrian Salvage, [place].  No prior cardiac history.  She does not have significant dyspnea on exertion, orthopnea, PND, pedal edema, chest pain or syncope.  She has noticed occasional tingling in her right upper extremity and was concerned.  Cardiology now asked to evaluate.  Note laboratories February 2023 showed total cholesterol 228 with LDL 147. ? ?Current Outpatient Medications  ?Medication Sig Dispense Refill  ? acetaminophen (TYLENOL) 500 MG tablet Take 500 mg by mouth every 6 (six) hours as needed.    ? albuterol (VENTOLIN HFA) 108 (90 Base) MCG/ACT inhaler Inhale 1-2 puffs into the lungs every 6 (six) hours as needed for wheezing or shortness of breath. 1 each 1  ? Calcium-Magnesium-Vitamin D (CITRACAL SLOW RELEASE PO) Take by mouth.    ? cetirizine (ZYRTEC) 10 MG tablet Take 10 mg by mouth daily.    ? diclofenac (VOLTAREN) 75 MG EC tablet Take 1 tablet (75 mg total) by mouth 2 (two) times daily. (Patient taking differently: Take 75 mg by mouth daily.) 60 tablet 1  ? Ferrous Sulfate (SLOW RELEASE IRON PO) Take by mouth once a week.    ? hydrochlorothiazide (MICROZIDE) 12.5 MG capsule Take 1 capsule (12.5 mg total) by mouth daily. 90 capsule 3  ? Lactobacillus-Inulin (CULTURELLE ADULT ULT BALANCE) CAPS Take by mouth daily.    ? Magnesium 250 MG TABS Take by mouth.    ? Multiple Vitamins-Minerals (ALIVE WOMENS 50+) CHEW Chew by mouth.    ? TURMERIC-GINGER PO Take by mouth.    ? UNABLE TO FIND Take 1,000 mg by mouth daily. Med Name: Milk Tistle    ? ?No current facility-administered medications for this visit.  ? ? ?No Known Allergies ? ? ?Past Medical History:  ?Diagnosis Date  ? Asthma   ? Headache   ? migraines in the past  ? PTSD (post-traumatic stress disorder)   ? ? ?Past Surgical History:  ?Procedure Laterality Date  ? CHOLECYSTECTOMY    ? HERNIA  REPAIR    ? umbilical hernia repair  ? KNEE SURGERY    ? SHOULDER HEMI-ARTHROPLASTY Left 06/18/2016  ? Procedure: LEFT SHOULDER HEMI-ARTHROPLASTY;  Surgeon: Nicholes Stairs, MD;  Location: [facility];  Service: Orthopedics;  Laterality: Left;  ? TUBAL LIGATION    ? ? ?Social History  ? ?Socioeconomic History  ? Marital status: Single  ?  Spouse name: Not on file  ? Number of children: 2  ? Years of education: Not on file  ? Highest education level: Not on file  ?Occupational History  ? Not on file  ?Tobacco Use  ? Smoking status: Former  ?  Types: Cigarettes  ?  Quit date: 06/17/2012  ?  Years since quitting: 9.2  ? Smokeless tobacco: Never  ?Substance and Sexual Activity  ? Alcohol use: Yes  ?  Comment: Rare  ? Drug use: No  ? Sexual activity: Not on file  ?Other Topics Concern  ? Not on file  ?Social History Narrative  ? Not on file  ? ?Social Determinants of Health  ? ?Financial Resource Strain: Not on file  ?Food Insecurity: Not on file  ?Transportation Needs: Not on file  ?Physical Activity: Not on file  ?Stress: Not on file  ?Social Connections: Not on file  ?Intimate Partner Violence: Not on file  ? ? ?Family History  ?Problem Relation Age of Onset  ? Alcohol  abuse Mother   ? Cancer Mother   ? Alcohol abuse Father   ? Heart disease Father   ? COPD Sister   ? Arthritis Sister   ? Alcohol abuse Sister   ? Mental illness Sister   ? Heart disease Brother   ? ? ?ROS: no fevers or chills, productive cough, hemoptysis, dysphasia, odynophagia, melena, hematochezia, dysuria, hematuria, rash, seizure activity, orthopnea, PND, pedal edema, claudication. Remaining systems are negative. ? ?Physical Exam:  ? ?Blood pressure 132/80, pulse 83, height 5\' 5"  (1.651 m), weight 289 lb (131.1 kg), last menstrual period 12/24/2010, SpO2 99 %. ? ?General:  Well developed/obese in NAD ?Skin warm/dry ?Patient not depressed ?No peripheral clubbing ?Back-normal ?HEENT-normal/normal eyelids ?Neck supple/normal carotid upstroke  bilaterally; positive bruit; no JVD; no thyromegaly ?chest - CTA/ normal expansion ?CV - RRR/normal S1 and S2; no murmurs, rubs or gallops;  PMI nondisplaced ?Abdomen -NT/ND, no HSM, no mass, + bowel sounds, no bruit ?2+ femoral pulses, no bruits ?Ext-no edema, chords, 2+ DP ?Neuro-grossly nonfocal ? ?ECG -normal sinus rhythm at a rate of 75, left ventricular hypertrophy.  Personally reviewed ? ?A/P ? ?1 hyperlipidemia-patient has no documented history of vascular disease.  She does have elevated cholesterol, remote tobacco use and brother with prior stent.  I will arrange a calcium score.  If significantly elevated we will add statin therapy with goal LDL less than 50.  Otherwise would begin diet with follow-up labs. ? ?2 right upper extremity tingling-we will arrange carotid Dopplers to rule out obstructive carotid disease. ? ?Kirk Ruths, MD ? ?

## 2021-09-02 ENCOUNTER — Other Ambulatory Visit: Payer: Self-pay

## 2021-09-02 ENCOUNTER — Encounter: Payer: Self-pay | Admitting: Cardiology

## 2021-09-02 ENCOUNTER — Ambulatory Visit (INDEPENDENT_AMBULATORY_CARE_PROVIDER_SITE_OTHER): Payer: Medicare (Managed Care) | Admitting: Cardiology

## 2021-09-02 VITALS — BP 132/80 | HR 83 | Ht 65.0 in | Wt 289.0 lb

## 2021-09-02 DIAGNOSIS — Z136 Encounter for screening for cardiovascular disorders: Secondary | ICD-10-CM

## 2021-09-02 DIAGNOSIS — R0989 Other specified symptoms and signs involving the circulatory and respiratory systems: Secondary | ICD-10-CM | POA: Diagnosis not present

## 2021-09-02 NOTE — Patient Instructions (Signed)
?  Testing/Procedures: ? ?Your physician has requested that you have a carotid duplex. This test is an ultrasound of the carotid arteries in your neck. It looks at blood flow through these arteries that supply the brain with blood. Allow one hour for this exam. There are no restrictions or special instructions. [facility] ? ?CORONARY CALCIUM SCORING CT SCAN AT [address] ? ? ?Follow-Up: ?At [facility], you and your health needs are our priority.  As part of our continuing mission to provide you with exceptional heart care, we have created designated Provider Care Teams.  These Care Teams include your primary Cardiologist (physician) and Advanced Practice Providers (APPs -  Physician Assistants and Nurse Practitioners) who all work together to provide you with the care you need, when you need it. ? ?We recommend signing up for the patient portal called "MyChart".  Sign up information is provided on this After Visit Summary.  MyChart is used to connect with patients for Virtual Visits (Telemedicine).  Patients are able to view lab/test results, encounter notes, upcoming appointments, etc.  Non-urgent messages can be sent to your provider as well.   ?To learn more about what you can do with MyChart, go to [email or web address].   ? ?Your next appointment:   ? ?AS NEEDED ?

## 2021-09-04 ENCOUNTER — Encounter: Payer: Self-pay | Admitting: Obstetrics and Gynecology

## 2021-09-04 ENCOUNTER — Ambulatory Visit (INDEPENDENT_AMBULATORY_CARE_PROVIDER_SITE_OTHER): Payer: Medicare (Managed Care) | Admitting: Obstetrics and Gynecology

## 2021-09-04 ENCOUNTER — Other Ambulatory Visit (HOSPITAL_COMMUNITY)
Admission: RE | Admit: 2021-09-04 | Discharge: 2021-09-04 | Disposition: A | Discharge status: Home / Self Care | Payer: Medicare (Managed Care) | Source: Ambulatory Visit | Attending: Obstetrics and Gynecology | Admitting: Obstetrics and Gynecology

## 2021-09-04 DIAGNOSIS — R87610 Atypical squamous cells of undetermined significance on cytologic smear of cervix (ASC-US): Secondary | ICD-10-CM | POA: Diagnosis not present

## 2021-09-04 DIAGNOSIS — R87810 Cervical high risk human papillomavirus (HPV) DNA test positive: Secondary | ICD-10-CM | POA: Insufficient documentation

## 2021-09-04 DIAGNOSIS — N879 Dysplasia of cervix uteri, unspecified: Secondary | ICD-10-CM | POA: Diagnosis not present

## 2021-09-04 NOTE — Progress Notes (Signed)
Patient given informed consent, signed copy in the chart, time out was performed.  Pap reviewed in chart, ASCUS with high risk HPV noted.  Placed in lithotomy position. Cervix viewed with speculum and colposcope after application of acetic acid and lugol's solution.  Band of nonstaining tissue was seen just to the left lateral of cervical os from 7 to 11 o clock.  ? ?Colposcopy adequate?  yes ?Acetowhite lesions? 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 no ?Mosaicism?   no ?Abnormal vasculature?  no ?Biopsies?yes at 9 and 11 o clock ?ECC? no ? ?COMMENTS: ?Patient was given post procedure instructions.  Management dependent of pathology results ?Griffin Basil, MD  ?

## 2021-09-08 ENCOUNTER — Encounter: Payer: Self-pay | Admitting: Obstetrics and Gynecology

## 2021-09-08 LAB — SURGICAL PATHOLOGY

## 2021-09-12 ENCOUNTER — Ambulatory Visit (INDEPENDENT_AMBULATORY_CARE_PROVIDER_SITE_OTHER): Payer: Medicare (Managed Care)

## 2021-09-12 ENCOUNTER — Ambulatory Visit (HOSPITAL_BASED_OUTPATIENT_CLINIC_OR_DEPARTMENT_OTHER)
Admission: RE | Admit: 2021-09-12 | Discharge: 2021-09-12 | Disposition: A | Discharge status: Home / Self Care | Payer: Medicare (Managed Care) | Source: Ambulatory Visit | Attending: Cardiology | Admitting: Cardiology

## 2021-09-12 DIAGNOSIS — R0989 Other specified symptoms and signs involving the circulatory and respiratory systems: Secondary | ICD-10-CM

## 2021-09-12 DIAGNOSIS — Z136 Encounter for screening for cardiovascular disorders: Secondary | ICD-10-CM | POA: Insufficient documentation

## 2021-09-15 ENCOUNTER — Other Ambulatory Visit: Payer: Self-pay | Admitting: *Deleted

## 2021-09-15 ENCOUNTER — Encounter: Payer: Self-pay | Admitting: Cardiology

## 2021-09-15 DIAGNOSIS — E7800 Pure hypercholesterolemia, unspecified: Secondary | ICD-10-CM

## 2021-09-15 DIAGNOSIS — I2510 Atherosclerotic heart disease of native coronary artery without angina pectoris: Secondary | ICD-10-CM

## 2021-09-15 MED ORDER — ROSUVASTATIN CALCIUM 40 MG PO TABS: 40.0000 mg | ORAL_TABLET | Freq: Every day | ORAL | 3 refills | Status: DC

## 2021-09-20 ENCOUNTER — Other Ambulatory Visit: Payer: Self-pay | Admitting: Family

## 2021-09-22 MED ORDER — ROSUVASTATIN CALCIUM 40 MG PO TABS: 40.0000 mg | ORAL_TABLET | Freq: Every day | ORAL | 3 refills | Status: DC

## 2021-09-24 ENCOUNTER — Encounter: Payer: Self-pay | Admitting: Family

## 2021-10-14 MED ORDER — ATORVASTATIN CALCIUM 10 MG PO TABS: 10.0000 mg | ORAL_TABLET | Freq: Every day | ORAL | 3 refills | Status: DC

## 2021-10-14 NOTE — Addendum Note (Signed)
Addended by: Rockne Menghini on: 10/14/2021 09:04 AM ? ? Modules accepted: Orders ? ?

## 2021-10-30 ENCOUNTER — Telehealth: Payer: Self-pay

## 2021-10-30 ENCOUNTER — Ambulatory Visit (INDEPENDENT_AMBULATORY_CARE_PROVIDER_SITE_OTHER): Payer: Medicare (Managed Care) | Admitting: Pharmacist

## 2021-10-30 ENCOUNTER — Telehealth: Payer: Self-pay | Admitting: Pharmacist

## 2021-10-30 VITALS — BP 141/84 | HR 70 | Resp 14 | Ht 65.0 in | Wt 285.0 lb

## 2021-10-30 DIAGNOSIS — I2510 Atherosclerotic heart disease of native coronary artery without angina pectoris: Secondary | ICD-10-CM | POA: Diagnosis not present

## 2021-10-30 DIAGNOSIS — E7800 Pure hypercholesterolemia, unspecified: Secondary | ICD-10-CM | POA: Diagnosis not present

## 2021-10-30 DIAGNOSIS — R7303 Prediabetes: Secondary | ICD-10-CM | POA: Diagnosis not present

## 2021-10-30 NOTE — Patient Instructions (Addendum)
It was nice meeting you today  We would like to reduce your LDL (bad cholesterol)  Since you could not tolerate the statins, we will start a new medication called Repatha or Praluent which you will inject once every 2 weeks  We will complete the prior authorization for you and contact you when it is approved  Once you start this medication we will recheck your cholesterol in 2-3 months  Please call with any questions  Karren Cobble, PharmD, [place], [place], [place], [place], [place], [address] Phone: [phone], Fax: [phone]

## 2021-10-30 NOTE — Telephone Encounter (Signed)
-----   Message from Rosalee Kaufman, RPH-CPP sent at 10/29/2021 11:49 AM EDT ----- Could you call to schedule lipid appt. Has failed multiple statins
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
## 2021-10-30 NOTE — Telephone Encounter (Signed)
Called and lmom the pt that we need them to call us back to schedule lipid clinic appt will route this call to myself to continue calling a couple more times

## 2021-10-30 NOTE — Telephone Encounter (Signed)
Please complete prior authorization for:  Name of medication, dose, and frequency Repatha 140mg  sq q 14 days or Praluent 75mg  sq q 14 days  Lab Orders Requested? no  Which labs? N/a  Estimated date for labs to be scheduled N/A  Does patient need activated copay card? No, gave patient assistance applications

## 2021-10-30 NOTE — Telephone Encounter (Signed)
Called and scheduled appt for today at 1:30pm

## 2021-10-31 LAB — HEMOGLOBIN A1C
Est. average glucose Bld gHb Est-mCnc: 120 mg/dL
Hgb A1c MFr Bld: 5.8 % — ABNORMAL HIGH (ref 4.8–5.6)

## 2021-10-31 NOTE — Telephone Encounter (Signed)
PA submitted for Repatha. Key: [facility]

## 2021-11-04 NOTE — Telephone Encounter (Signed)
Pa denied I will need the denial letter in order to work on an appeal. Routing to dr. Delma Officer to make aware.   This is what I got on cmm: Stephanie Hensley (Key: [id number]) Repatha SureClick 140MG /ML auto-injectors   Form [employer] Electronic PA Form (2017 NCPDP) Created 6 hours ago Sent to Plan 6 hours ago Plan Response 6 hours ago Submit Clinical Questions Determination Message from Plan PA was already submitted for this patient and drug which was denied.;03-28-2005;Appeal Information: Attention:ATTN: MEDICARE CLINICAL APPEALS [employer] [address]:[id number];ZJQBHA:LPFXTK. [phone] [phone] Fax:[phone] WebAddress:[email or web address];

## 2021-11-11 ENCOUNTER — Other Ambulatory Visit: Payer: Self-pay

## 2021-11-11 DIAGNOSIS — H2513 Age-related nuclear cataract, bilateral: Secondary | ICD-10-CM | POA: Diagnosis not present

## 2021-11-11 DIAGNOSIS — H18593 Other hereditary corneal dystrophies, bilateral: Secondary | ICD-10-CM | POA: Diagnosis not present

## 2021-11-11 DIAGNOSIS — H2511 Age-related nuclear cataract, right eye: Secondary | ICD-10-CM | POA: Diagnosis not present

## 2021-11-11 DIAGNOSIS — H25043 Posterior subcapsular polar age-related cataract, bilateral: Secondary | ICD-10-CM | POA: Diagnosis not present

## 2021-11-11 DIAGNOSIS — H35371 Puckering of macula, right eye: Secondary | ICD-10-CM | POA: Diagnosis not present

## 2021-11-11 DIAGNOSIS — H18413 Arcus senilis, bilateral: Secondary | ICD-10-CM | POA: Diagnosis not present

## 2021-11-11 MED ORDER — REPATHA SURECLICK 140 MG/ML ~~LOC~~ SOAJ: 140.0000 mg | SUBCUTANEOUS | 11 refills | Status: DC

## 2021-11-19 ENCOUNTER — Other Ambulatory Visit: Payer: Self-pay

## 2021-11-19 ENCOUNTER — Other Ambulatory Visit: Payer: Self-pay | Admitting: Family

## 2021-11-19 MED ORDER — DICLOFENAC SODIUM 75 MG PO TBEC: DELAYED_RELEASE_TABLET | ORAL | 1 refills | Status: DC

## 2021-11-21 ENCOUNTER — Ambulatory Visit (INDEPENDENT_AMBULATORY_CARE_PROVIDER_SITE_OTHER): Payer: Medicare (Managed Care) | Admitting: Family

## 2021-11-21 ENCOUNTER — Encounter: Payer: Self-pay | Admitting: Family

## 2021-11-21 VITALS — BP 130/80 | HR 83 | Resp 20 | Ht 65.0 in | Wt 289.0 lb

## 2021-11-21 DIAGNOSIS — R600 Localized edema: Secondary | ICD-10-CM | POA: Diagnosis not present

## 2021-11-21 MED ORDER — DOXYCYCLINE HYCLATE 100 MG PO TABS: 100.0000 mg | ORAL_TABLET | Freq: Two times a day (BID) | ORAL | 0 refills | Status: DC

## 2021-11-21 NOTE — Progress Notes (Signed)
Stephanie Hensley is a 61 y.o. female with the following history as recorded in EpicCare:  Patient Active Problem List   Diagnosis Date Noted   ASCUS with positive high risk HPV cervical 09/04/2021   Cutaneous candidiasis 05/10/2020   Asthma 04/19/2020   Depression 04/19/2020   Nuclear sclerotic cataract of right eye 04/19/2020   Epiretinal membrane (ERM) of right eye 04/19/2020   Localized traumatic cataract of right eye 04/19/2020   Post traumatic stress disorder 04/19/2020   Morbid obesity (HCC) 09/01/2019   Tibia/fibula fracture, right, closed, with nonunion, subsequent encounter 07/06/2019   Closed 4-part fracture of proximal humerus, left, initial encounter 06/18/2016    Current Outpatient Medications  Medication Sig Dispense Refill   acetaminophen (TYLENOL) 500 MG tablet Take 500 mg by mouth every 6 (six) hours as needed.     albuterol (VENTOLIN HFA) 108 (90 Base) MCG/ACT inhaler Inhale 1-2 puffs into the lungs every 6 (six) hours as needed for wheezing or shortness of breath. 1 each 1   Calcium-Magnesium-Vitamin D (CITRACAL SLOW RELEASE PO) Take by mouth.     cetirizine (ZYRTEC) 10 MG tablet Take 10 mg by mouth daily.     diclofenac (VOLTAREN) 75 MG EC tablet TAKE 1 TABLET(75 MG) BY MOUTH TWICE DAILY 60 tablet 1   doxycycline (VIBRA-TABS) 100 MG tablet Take 1 tablet (100 mg total) by mouth 2 (two) times daily. 14 tablet 0   Evolocumab (REPATHA SURECLICK) 140 MG/ML SOAJ Inject 140 mg into the skin every 14 (fourteen) days. 2 mL 11   Ferrous Sulfate (SLOW RELEASE IRON PO) Take by mouth once a week.     hydrochlorothiazide (MICROZIDE) 12.5 MG capsule Take 1 capsule (12.5 mg total) by mouth daily. 90 capsule 3   Lactobacillus-Inulin (CULTURELLE ADULT ULT BALANCE) CAPS Take by mouth daily.     Magnesium 250 MG TABS Take by mouth.     Multiple Vitamins-Minerals (ALIVE WOMENS 50+) CHEW Chew by mouth.     TURMERIC-GINGER PO Take by mouth.     UNABLE TO FIND Take 1,000 mg by mouth daily.  Med Name: Milk Tistle     No current facility-administered medications for this visit.    Allergies: Crestor [rosuvastatin] and Lipitor [atorvastatin]  Past Medical History:  Diagnosis Date   Asthma    Headache    migraines in the past   PTSD (post-traumatic stress disorder)     Past Surgical History:  Procedure Laterality Date   CHOLECYSTECTOMY     HERNIA REPAIR     umbilical hernia repair   KNEE SURGERY     SHOULDER HEMI-ARTHROPLASTY Left 06/18/2016   Procedure: LEFT SHOULDER HEMI-ARTHROPLASTY;  Surgeon: Yolonda Kida, MD;  Location: [facility] OR;  Service: Orthopedics;  Laterality: Left;   TUBAL LIGATION      Family History  Problem Relation Age of Onset   Alcohol abuse Mother    Cancer Mother    Alcohol abuse Father    Heart disease Father    COPD Sister    Arthritis Sister    Alcohol abuse Sister    Mental illness Sister    Heart disease Brother     Social History   Tobacco Use   Smoking status: Former    Types: Cigarettes    Quit date: 06/17/2012    Years since quitting: 9.4   Smokeless tobacco: Never  Substance Use Topics   Alcohol use: Yes    Comment: Rare    Subjective:  Known history of chronic venous stasis dermatitis;  complaining of leg pain/ edema x 2 days; felt "tingling" in front of leg last night; no pain in calf; notes that symptoms are in the same area she has always experienced her symptoms; no known injury or trauma;     Objective:  Vitals:   11/21/21 1456  BP: 130/80  Pulse: 83  Resp: 20  SpO2: 96%  Weight: 289 lb (131.1 kg)  Height: 5\' 5"  (1.651 m)    General: Well developed, well nourished, in no acute distress  Skin : Warm and dry.  Head: Normocephalic and atraumatic  Lungs: Respirations unlabored;  Musculoskeletal: No deformities; no active joint inflammation  Extremities: +mild pedal edema right lower leg, cyanosis, clubbing; negative Homan's sign; calf circumference bilaterally is 14 inches Vessels: Symmetric bilaterally   Neurologic: Alert and oriented; speech intact; face symmetrical; moves all extremities well; CNII-XII intact without focal deficit  Assessment:  1. Pedal edema     Plan:   Will treat for suspected cellulitis/ venous stasis dermatitis; patient is seen at 3 pm on Friday and unable to get doppler- strict ER precautions if symptoms worsen or do not improve over the weekend; follow up to be determined.   No follow-ups on file.  No orders of the defined types were placed in this encounter.   Requested Prescriptions   Signed Prescriptions Disp Refills   doxycycline (VIBRA-TABS) 100 MG tablet 14 tablet 0    Sig: Take 1 tablet (100 mg total) by mouth 2 (two) times daily.

## 2021-11-26 ENCOUNTER — Telehealth: Payer: Self-pay

## 2021-11-26 NOTE — Telephone Encounter (Signed)
error 

## 2021-11-26 NOTE — Telephone Encounter (Signed)
Called pt and was advised  

## 2021-12-02 ENCOUNTER — Encounter: Payer: Self-pay | Admitting: Family

## 2021-12-08 DIAGNOSIS — H25813 Combined forms of age-related cataract, bilateral: Secondary | ICD-10-CM | POA: Diagnosis not present

## 2021-12-08 DIAGNOSIS — H43811 Vitreous degeneration, right eye: Secondary | ICD-10-CM | POA: Diagnosis not present

## 2021-12-08 DIAGNOSIS — H35371 Puckering of macula, right eye: Secondary | ICD-10-CM | POA: Diagnosis not present

## 2021-12-08 DIAGNOSIS — S0511XD Contusion of eyeball and orbital tissues, right eye, subsequent encounter: Secondary | ICD-10-CM | POA: Diagnosis not present

## 2021-12-08 DIAGNOSIS — Z87828 Personal history of other (healed) physical injury and trauma: Secondary | ICD-10-CM | POA: Diagnosis not present

## 2021-12-08 DIAGNOSIS — H35453 Secondary pigmentary degeneration, bilateral: Secondary | ICD-10-CM | POA: Diagnosis not present

## 2021-12-08 DIAGNOSIS — H35363 Drusen (degenerative) of macula, bilateral: Secondary | ICD-10-CM | POA: Diagnosis not present

## 2021-12-11 ENCOUNTER — Ambulatory Visit: Payer: Medicare (Managed Care) | Admitting: Family

## 2021-12-24 ENCOUNTER — Other Ambulatory Visit: Payer: Self-pay | Admitting: *Deleted

## 2021-12-24 DIAGNOSIS — E7800 Pure hypercholesterolemia, unspecified: Secondary | ICD-10-CM

## 2021-12-26 DIAGNOSIS — H2511 Age-related nuclear cataract, right eye: Secondary | ICD-10-CM | POA: Diagnosis not present

## 2022-01-01 DIAGNOSIS — E7800 Pure hypercholesterolemia, unspecified: Secondary | ICD-10-CM | POA: Diagnosis not present

## 2022-01-01 LAB — LIPID PANEL
Chol/HDL Ratio: 3.5 ratio (ref 0.0–4.4)
Cholesterol, Total: 170 mg/dL (ref 100–199)
HDL: 49 mg/dL (ref >39)
LDL Chol Calc (NIH): 90 mg/dL (ref 0–99)
Triglycerides: 183 mg/dL — ABNORMAL HIGH (ref 0–149)
VLDL Cholesterol Cal: 31 mg/dL (ref 5–40)

## 2022-01-01 LAB — HEPATIC FUNCTION PANEL
ALT: 7 IU/L (ref 0–32)
AST: 20 IU/L (ref 0–40)
Albumin: 4.2 g/dL (ref 3.8–4.9)
Alkaline Phosphatase: 97 IU/L (ref 44–121)
Bilirubin Total: 0.3 mg/dL (ref 0.0–1.2)
Bilirubin, Direct: <0.1 mg/dL (ref 0.00–0.40)
Total Protein: 7 g/dL (ref 6.0–8.5)

## 2022-01-02 ENCOUNTER — Other Ambulatory Visit: Payer: Self-pay

## 2022-01-02 ENCOUNTER — Encounter: Payer: Self-pay | Admitting: Cardiology

## 2022-01-02 ENCOUNTER — Other Ambulatory Visit: Payer: Self-pay | Admitting: *Deleted

## 2022-01-02 ENCOUNTER — Telehealth: Payer: Self-pay | Admitting: Cardiology

## 2022-01-02 DIAGNOSIS — E7800 Pure hypercholesterolemia, unspecified: Secondary | ICD-10-CM

## 2022-01-02 DIAGNOSIS — I2510 Atherosclerotic heart disease of native coronary artery without angina pectoris: Secondary | ICD-10-CM

## 2022-01-02 MED ORDER — EZETIMIBE 10 MG PO TABS: 10.0000 mg | ORAL_TABLET | Freq: Every day | ORAL | 3 refills | Status: DC

## 2022-01-02 NOTE — Telephone Encounter (Signed)
Spoke to patient recent lab results given.Dr.Crenshaw advised to start Zetia 10 mg daily.Continue all other medications.Repeat fasting lipid and hepatic panels in 8 weeks.Lab orders mailed.

## 2022-01-02 NOTE — Telephone Encounter (Signed)
Returned call to patient left message on personal voice mail to call back. 

## 2022-01-02 NOTE — Telephone Encounter (Signed)
Patient called to follow up on the message she left in MyChart:  "January 02, 2022 Stephanie Hensley "Kim" to P [facility] Triage (supporting Lewayne Bunting, MD)      01/02/22  8:19 AM Do I continue taking the repatha every other week? What is my total cholesterol count? When I was there with the pharmacist, he said it was well over 300. Kim "  Please advise.

## 2022-01-02 NOTE — Telephone Encounter (Signed)
Patient returning call.

## 2022-01-09 ENCOUNTER — Ambulatory Visit (INDEPENDENT_AMBULATORY_CARE_PROVIDER_SITE_OTHER): Payer: Medicare (Managed Care) | Admitting: Family

## 2022-01-09 VITALS — BP 118/80 | HR 66 | Temp 98.6°F | Resp 18 | Ht 65.0 in | Wt 289.0 lb

## 2022-01-09 DIAGNOSIS — J4520 Mild intermittent asthma, uncomplicated: Secondary | ICD-10-CM | POA: Diagnosis not present

## 2022-01-09 MED ORDER — ALBUTEROL SULFATE HFA 108 (90 BASE) MCG/ACT IN AERS: 1.0000 | INHALATION_SPRAY | Freq: Four times a day (QID) | RESPIRATORY_TRACT | 3 refills | Status: DC | PRN

## 2022-01-09 NOTE — Progress Notes (Signed)
Stephanie Hensley is a 61 y.o. female with the following history as recorded in EpicCare:  Patient Active Problem List   Diagnosis Date Noted   ASCUS with positive high risk HPV cervical 09/04/2021   Cutaneous candidiasis 05/10/2020   Asthma 04/19/2020   Depression 04/19/2020   Nuclear sclerotic cataract of right eye 04/19/2020   Epiretinal membrane (ERM) of right eye 04/19/2020   Localized traumatic cataract of right eye 04/19/2020   Post traumatic stress disorder 04/19/2020   Morbid obesity (HCC) 09/01/2019   Tibia/fibula fracture, right, closed, with nonunion, subsequent encounter 07/06/2019   Closed 4-part fracture of proximal humerus, left, initial encounter 06/18/2016    Current Outpatient Medications  Medication Sig Dispense Refill   acetaminophen (TYLENOL) 500 MG tablet Take 500 mg by mouth every 6 (six) hours as needed.     Calcium-Magnesium-Vitamin D (CITRACAL SLOW RELEASE PO) Take by mouth.     cetirizine (ZYRTEC) 10 MG tablet Take 10 mg by mouth daily.     diclofenac (VOLTAREN) 75 MG EC tablet TAKE 1 TABLET(75 MG) BY MOUTH TWICE DAILY 60 tablet 1   Evolocumab (REPATHA SURECLICK) 140 MG/ML SOAJ Inject 140 mg into the skin every 14 (fourteen) days. 2 mL 11   ezetimibe (ZETIA) 10 MG tablet Take 1 tablet (10 mg total) by mouth daily. 90 tablet 3   Ferrous Sulfate (SLOW RELEASE IRON PO) Take by mouth once a week.     hydrochlorothiazide (MICROZIDE) 12.5 MG capsule Take 1 capsule (12.5 mg total) by mouth daily. (Patient taking differently: Take 12.5 mg by mouth daily. Use prn for swelling) 90 capsule 3   Magnesium 250 MG TABS Take by mouth.     Multiple Vitamins-Minerals (ALIVE WOMENS 50+) CHEW Chew by mouth.     TURMERIC-GINGER PO Take by mouth.     albuterol (VENTOLIN HFA) 108 (90 Base) MCG/ACT inhaler Inhale 1-2 puffs into the lungs every 6 (six) hours as needed for wheezing or shortness of breath. 1 each 3   No current facility-administered medications for this visit.     Allergies: Crestor [rosuvastatin] and Lipitor [atorvastatin]  Past Medical History:  Diagnosis Date   Asthma    Headache    migraines in the past   PTSD (post-traumatic stress disorder)     Past Surgical History:  Procedure Laterality Date   CHOLECYSTECTOMY     HERNIA REPAIR     umbilical hernia repair   KNEE SURGERY     SHOULDER HEMI-ARTHROPLASTY Left 06/18/2016   Procedure: LEFT SHOULDER HEMI-ARTHROPLASTY;  Surgeon: Yolonda Kida, MD;  Location: [facility];  Service: Orthopedics;  Laterality: Left;   TUBAL LIGATION      Family History  Problem Relation Age of Onset   Alcohol abuse Mother    Cancer Mother    Alcohol abuse Father    Heart disease Father    COPD Sister    Arthritis Sister    Alcohol abuse Sister    Mental illness Sister    Heart disease Brother     Social History   Tobacco Use   Smoking status: Former    Types: Cigarettes    Quit date: 06/17/2012    Years since quitting: 9.5   Smokeless tobacco: Never  Substance Use Topics   Alcohol use: Yes    Comment: Rare    Subjective:   Requesting updated refills on albuterol; had called and was told that OV was required to update; no acute concerns with her breathing;  Is working hard on  lifestyle changes- tolerating Repatha well; has recently started taking Zetia;   Would also like the option to use HCTZ as needed instead of everyday;     Objective:  Vitals:   01/09/22 1307  BP: 118/80  Pulse: 66  Resp: 18  Temp: 98.6 F (37 C)  TempSrc: Temporal  SpO2: 98%  Weight: 289 lb (131.1 kg)  Height: 5\' 5"  (1.651 m)    General: Well developed, well nourished, in no acute distress  Skin : Warm and dry.  Head: Normocephalic and atraumatic  Lungs: Respirations unlabored;  Neurologic: Alert and oriented; speech intact; face symmetrical; moves all extremities well; CNII-XII intact without focal deficit   Assessment:  1. Mild intermittent asthma without complication     Plan:  Stable; refill updated  as requested; follow up as needed otherwise.   No follow-ups on file.  No orders of the defined types were placed in this encounter.   Requested Prescriptions   Signed Prescriptions Disp Refills   albuterol (VENTOLIN HFA) 108 (90 Base) MCG/ACT inhaler 1 each 3    Sig: Inhale 1-2 puffs into the lungs every 6 (six) hours as needed for wheezing or shortness of breath.

## 2022-01-18 ENCOUNTER — Other Ambulatory Visit: Payer: Self-pay | Admitting: Family

## 2022-02-03 ENCOUNTER — Ambulatory Visit: Payer: Medicare (Managed Care) | Admitting: Family

## 2022-02-04 ENCOUNTER — Telehealth: Payer: Self-pay | Admitting: Cardiology

## 2022-02-04 NOTE — Telephone Encounter (Signed)
New Message:      Erskine Squibb from [facility] called. She wanted Dr Jens Som to know that patient says she stopped taking her Atorvastatin. If he wants her to continue taking   it, please call and let her know..   Pt c/o medication issue:  1. Name of Medication: Atorvastatin  2. How are you currently taking this medication (dosage and times per day)? 1 time a day  3. Are you having a reaction (difficulty breathing--STAT)?   4. What is your medication issue? Is patient supposed to be taking the Atorvastatin

## 2022-02-05 NOTE — Telephone Encounter (Signed)
Verified with patient that she is no longer taking atorvastatin. She is taking repatha 140mg /mL injections. She stated she is not having any symptoms from repatha. She stated that she informed of this.

## 2022-02-05 NOTE — Telephone Encounter (Signed)
[employer] is closed. Unable to leave message. Left message for patient to call clinic.

## 2022-03-02 DIAGNOSIS — E7800 Pure hypercholesterolemia, unspecified: Secondary | ICD-10-CM | POA: Diagnosis not present

## 2022-03-02 DIAGNOSIS — I2510 Atherosclerotic heart disease of native coronary artery without angina pectoris: Secondary | ICD-10-CM | POA: Diagnosis not present

## 2022-03-02 LAB — LIPID PANEL
Chol/HDL Ratio: 2.1 ratio (ref 0.0–4.4)
Cholesterol, Total: 106 mg/dL (ref 100–199)
HDL: 50 mg/dL (ref >39)
LDL Chol Calc (NIH): 33 mg/dL (ref 0–99)
Triglycerides: 133 mg/dL (ref 0–149)
VLDL Cholesterol Cal: 23 mg/dL (ref 5–40)

## 2022-03-02 LAB — HEPATIC FUNCTION PANEL
ALT: 9 IU/L (ref 0–32)
AST: 24 IU/L (ref 0–40)
Albumin: 4.1 g/dL (ref 3.8–4.9)
Alkaline Phosphatase: 93 IU/L (ref 44–121)
Bilirubin Total: 0.3 mg/dL (ref 0.0–1.2)
Bilirubin, Direct: 0.13 mg/dL (ref 0.00–0.40)
Total Protein: 6.9 g/dL (ref 6.0–8.5)

## 2022-03-03 ENCOUNTER — Encounter: Payer: Self-pay | Admitting: Cardiology

## 2022-03-10 ENCOUNTER — Other Ambulatory Visit: Payer: Self-pay | Admitting: Family

## 2022-03-10 DIAGNOSIS — Z1231 Encounter for screening mammogram for malignant neoplasm of breast: Secondary | ICD-10-CM

## 2022-03-11 DIAGNOSIS — I872 Venous insufficiency (chronic) (peripheral): Secondary | ICD-10-CM | POA: Diagnosis not present

## 2022-03-11 DIAGNOSIS — T8484XA Pain due to internal orthopedic prosthetic devices, implants and grafts, initial encounter: Secondary | ICD-10-CM | POA: Diagnosis not present

## 2022-03-11 DIAGNOSIS — M76821 Posterior tibial tendinitis, right leg: Secondary | ICD-10-CM | POA: Diagnosis not present

## 2022-03-11 DIAGNOSIS — M79661 Pain in right lower leg: Secondary | ICD-10-CM | POA: Diagnosis not present

## 2022-03-19 ENCOUNTER — Other Ambulatory Visit: Payer: Self-pay | Admitting: Family

## 2022-03-25 DIAGNOSIS — M76821 Posterior tibial tendinitis, right leg: Secondary | ICD-10-CM | POA: Diagnosis not present

## 2022-04-08 DIAGNOSIS — H35371 Puckering of macula, right eye: Secondary | ICD-10-CM | POA: Diagnosis not present

## 2022-04-08 DIAGNOSIS — H35453 Secondary pigmentary degeneration, bilateral: Secondary | ICD-10-CM | POA: Diagnosis not present

## 2022-04-08 DIAGNOSIS — Z961 Presence of intraocular lens: Secondary | ICD-10-CM | POA: Diagnosis not present

## 2022-04-08 DIAGNOSIS — H25812 Combined forms of age-related cataract, left eye: Secondary | ICD-10-CM | POA: Diagnosis not present

## 2022-04-08 DIAGNOSIS — H35363 Drusen (degenerative) of macula, bilateral: Secondary | ICD-10-CM | POA: Diagnosis not present

## 2022-04-08 DIAGNOSIS — S0511XS Contusion of eyeball and orbital tissues, right eye, sequela: Secondary | ICD-10-CM | POA: Diagnosis not present

## 2022-04-08 DIAGNOSIS — H353131 Nonexudative age-related macular degeneration, bilateral, early dry stage: Secondary | ICD-10-CM | POA: Diagnosis not present

## 2022-04-13 ENCOUNTER — Ambulatory Visit: Payer: Medicare (Managed Care)

## 2022-04-14 ENCOUNTER — Other Ambulatory Visit: Payer: Self-pay | Admitting: Family

## 2022-04-14 ENCOUNTER — Encounter: Payer: Self-pay | Admitting: Family

## 2022-04-14 DIAGNOSIS — Z87891 Personal history of nicotine dependence: Secondary | ICD-10-CM

## 2022-04-16 ENCOUNTER — Encounter: Payer: Self-pay | Admitting: Family

## 2022-04-16 DIAGNOSIS — H3530 Unspecified macular degeneration: Secondary | ICD-10-CM | POA: Insufficient documentation

## 2022-04-16 NOTE — Telephone Encounter (Signed)
I have called the pt to gather more information on what she is requesting. Pt reports that she wanted to give the provider a FYI that she has macular degeneration in both eyes and cataract surgery done last year. I stated understanding and informed her that we have done there referral for her lung CT scan. Pt reports that she is going to get Dr. Eliane Decree to send [place] a letter or notes refecting the information that she has just given [place].   No action needed at this time. Just FYI

## 2022-05-04 DIAGNOSIS — M76821 Posterior tibial tendinitis, right leg: Secondary | ICD-10-CM | POA: Diagnosis not present

## 2022-05-07 ENCOUNTER — Encounter: Payer: Self-pay | Admitting: Cardiology

## 2022-05-11 DIAGNOSIS — M76821 Posterior tibial tendinitis, right leg: Secondary | ICD-10-CM | POA: Diagnosis not present

## 2022-05-12 ENCOUNTER — Ambulatory Visit
Admission: RE | Admit: 2022-05-12 | Discharge: 2022-05-12 | Disposition: A | Discharge status: Home / Self Care | Payer: Medicare (Managed Care) | Source: Ambulatory Visit | Attending: Family | Admitting: Family

## 2022-05-12 ENCOUNTER — Other Ambulatory Visit: Payer: Self-pay | Admitting: Family

## 2022-05-12 ENCOUNTER — Encounter: Payer: Self-pay | Admitting: Family

## 2022-05-12 DIAGNOSIS — Z1231 Encounter for screening mammogram for malignant neoplasm of breast: Secondary | ICD-10-CM

## 2022-05-25 ENCOUNTER — Encounter: Payer: Self-pay | Admitting: Family Medicine

## 2022-05-25 ENCOUNTER — Ambulatory Visit (INDEPENDENT_AMBULATORY_CARE_PROVIDER_SITE_OTHER): Payer: Medicare (Managed Care) | Admitting: Family Medicine

## 2022-05-25 VITALS — BP 130/88 | HR 87 | Temp 99.3°F | Ht 65.0 in | Wt 282.5 lb

## 2022-05-25 DIAGNOSIS — J0100 Acute maxillary sinusitis, unspecified: Secondary | ICD-10-CM

## 2022-05-25 MED ORDER — DOXYCYCLINE HYCLATE 100 MG PO TABS: 100.0000 mg | ORAL_TABLET | Freq: Two times a day (BID) | ORAL | 0 refills | Status: AC

## 2022-05-25 MED ORDER — PREDNISONE 20 MG PO TABS: 40.0000 mg | ORAL_TABLET | Freq: Every day | ORAL | 0 refills | Status: AC

## 2022-05-25 NOTE — Patient Instructions (Addendum)
Continue to push fluids, practice good hand hygiene, and cover your mouth if you cough.  If you start having fevers, shaking or shortness of breath, seek immediate care.  OK to take Tylenol 1000 mg (2 extra strength tabs) or 975 mg (3 regular strength tabs) every 6 hours as needed.  On the off chance it turns positive after you leave, we will adjust the plan accordingly.   Let us know if you need anything.

## 2022-05-25 NOTE — Progress Notes (Signed)
Chief Complaint  Patient presents with   Sinusitis    Sore throat Congestion Cough Covid -- Twice Negative     Stephanie Hensley here for URI complaints.  Duration: 3 days  Associated symptoms: Fever (102 F), sinus congestion, sinus pain, rhinorrhea, ear fullness, sore throat, shortness of breath, chest tightness, and coughing Denies: itchy watery eyes, ear pain, ear drainage, wheezing, myalgia, and dental pain Treatment to date: Dayquil, Tylenol, Nyquil  Sick contacts: No Tested neg for covid x 2.   Past Medical History:  Diagnosis Date   Asthma    Headache    migraines in the past   PTSD (post-traumatic stress disorder)     Objective BP 130/88 (BP Location: Left Arm, Patient Position: Sitting, Cuff Size: Large)   Pulse 87   Temp 99.3 F (37.4 C) (Oral)   Ht 5\' 5"  (1.651 m)   Wt 282 lb 8 oz (128.1 kg)   LMP 12/24/2010   SpO2 97%   BMI 47.01 kg/m  General: Awake, alert, appears stated age HEENT: AT, [place], ears patent b/l and TM's neg, nares patent w/o discharge, pharynx pink and without exudates, MMM, +ttp over max sinuses b/l Neck: No masses or asymmetry Heart: RRR Lungs: CTAB, no accessory muscle use Psych: Age appropriate judgment and insight, normal mood and affect  Acute maxillary sinusitis, recurrence not specified - Plan: predniSONE (DELTASONE) 20 MG tablet, doxycycline (VIBRA-TABS) 100 MG tablet  Given fever, will tx w doxy. 5 d pred burst 40 mg/d. Tested neg for covid x 2, tested neg for flu in here. Continue to push fluids, practice good hand hygiene, cover mouth when coughing. F/u prn. If starting to experience worsening s/s's, shaking, or shortness of breath, seek immediate care. Pt voiced understanding and agreement to the plan.  12/26/2010 [place], DO 05/25/22 3:31 PM

## 2022-05-28 ENCOUNTER — Encounter: Payer: Self-pay | Admitting: Family Medicine

## 2022-07-20 ENCOUNTER — Other Ambulatory Visit: Payer: Self-pay | Admitting: Family

## 2022-07-30 ENCOUNTER — Telehealth: Payer: Self-pay | Admitting: Family

## 2022-07-30 NOTE — Telephone Encounter (Signed)
Contacted Jasper Loser to schedule their annual wellness visit. Appointment made for 08/10/2022.  Sherol Dade; Care Guide [facility] Direct Dial: [phone]

## 2022-08-10 ENCOUNTER — Ambulatory Visit: Payer: Medicare (Managed Care)

## 2022-08-18 ENCOUNTER — Ambulatory Visit (INDEPENDENT_AMBULATORY_CARE_PROVIDER_SITE_OTHER): Payer: Medicare (Managed Care) | Admitting: *Deleted

## 2022-08-18 ENCOUNTER — Other Ambulatory Visit: Payer: Self-pay | Admitting: Family

## 2022-08-18 DIAGNOSIS — Z0000 Encounter for general adult medical examination without abnormal findings: Secondary | ICD-10-CM | POA: Diagnosis not present

## 2022-08-18 NOTE — Progress Notes (Signed)
Subjective:   Stephanie Hensley is a 62 y.o. female who presents for an Initial Medicare Annual Wellness Visit.  I connected with  Stephanie Hensley on 08/18/22 by a audio enabled telemedicine application and verified that I am speaking with the correct person using two identifiers.  Patient Location: Home  Provider Location: Office/Clinic  I discussed the limitations of evaluation and management by telemedicine. The patient expressed understanding and agreed to proceed.   Review of Systems     Cardiac Risk Factors include: obesity (BMI >30kg/m2)     Objective:    There were no vitals filed for this visit. There is no height or weight on file to calculate BMI.     08/18/2022    3:02 PM 07/18/2021    2:27 PM 11/22/2018    9:11 AM 03/18/2018    5:07 PM 08/03/2016   11:13 AM 06/01/2016    7:51 PM  Advanced Directives  Does Patient Have a Medical Advance Directive? No No No No No No  Would patient like information on creating a medical advance directive? No - Patient declined Yes (MAU/Ambulatory/Procedural Areas - Information given)  No - Patient declined No - Patient declined     Current Medications (verified) Outpatient Encounter Medications as of 08/18/2022  Medication Sig   acetaminophen (TYLENOL) 500 MG tablet Take 500 mg by mouth every 6 (six) hours as needed.   albuterol (VENTOLIN HFA) 108 (90 Base) MCG/ACT inhaler Inhale 1-2 puffs into the lungs every 6 (six) hours as needed for wheezing or shortness of breath.   Calcium-Magnesium-Vitamin D (CITRACAL SLOW RELEASE PO) Take by mouth.   cetirizine (ZYRTEC) 10 MG tablet Take 10 mg by mouth daily.   diclofenac (VOLTAREN) 75 MG EC tablet TAKE 1 TABLET(75 MG) BY MOUTH TWICE DAILY   Evolocumab (REPATHA SURECLICK) [id number] MG/ML SOAJ Inject 140 mg into the skin every 14 (fourteen) days.   ezetimibe (ZETIA) 10 MG tablet Take 1 tablet (10 mg total) by mouth daily.   Ferrous Sulfate (SLOW RELEASE IRON PO) Take by mouth once a week.    hydrochlorothiazide (MICROZIDE) 12.5 MG capsule TAKE 1 CAPSULE(12.5 MG) BY MOUTH DAILY   Magnesium 250 MG TABS Take by mouth.   Multiple Vitamins-Minerals (ALIVE WOMENS 50+) CHEW Chew by mouth.   TURMERIC-GINGER PO Take by mouth.   [DISCONTINUED] hydrochlorothiazide (MICROZIDE) 12.5 MG capsule Take 1 capsule (12.5 mg total) by mouth daily. (Patient taking differently: Take 12.5 mg by mouth daily. Use prn for swelling)   No facility-administered encounter medications on file as of 08/18/2022.    Allergies (verified) Crestor [rosuvastatin] and Lipitor [atorvastatin]   History: Past Medical History:  Diagnosis Date   Asthma    Headache    migraines in the past   PTSD (post-traumatic stress disorder)    Past Surgical History:  Procedure Laterality Date   CHOLECYSTECTOMY     HERNIA REPAIR     umbilical hernia repair   KNEE SURGERY     SHOULDER HEMI-ARTHROPLASTY Left 06/18/2016   Procedure: LEFT SHOULDER HEMI-ARTHROPLASTY;  Surgeon: Nicholes Stairs, MD;  Location: [facility];  Service: Orthopedics;  Laterality: Left;   TUBAL LIGATION     Family History  Problem Relation Age of Onset   Alcohol abuse Mother    Cancer Mother    Alcohol abuse Father    Heart disease Father    COPD Sister    Arthritis Sister    Alcohol abuse Sister    Mental illness Sister    Heart  disease Brother    Social History   Socioeconomic History   Marital status: Single    Spouse name: Not on file   Number of children: 2   Years of education: Not on file   Highest education level: Not on file  Occupational History   Not on file  Tobacco Use   Smoking status: Former    Types: Cigarettes    Quit date: 06/17/2012    Years since quitting: 10.1   Smokeless tobacco: Never  Substance and Sexual Activity   Alcohol use: Yes    Comment: Rare   Drug use: No   Sexual activity: Not on file  Other Topics Concern   Not on file  Social History Narrative   Not on file   Social Determinants of Health    Financial Resource Strain: Medium Risk (08/18/2022)   Overall Financial Resource Strain (CARDIA)    Difficulty of Paying Living Expenses: Somewhat hard  Food Insecurity: No Food Insecurity (08/18/2022)   Hunger Vital Sign    Worried About Running Out of Food in the Last Year: Never true    Ran Out of Food in the Last Year: Never true  Transportation Needs: No Transportation Needs (08/18/2022)   PRAPARE - Hydrologist (Medical): No    Lack of Transportation (Non-Medical): No  Physical Activity: Inactive (08/18/2022)   Exercise Vital Sign    Days of Exercise per Week: 0 days    Minutes of Exercise per Session: 0 min  Stress: Stress Concern Present (08/18/2022)   [place]    Feeling of Stress : To some extent  Social Connections: Moderately Isolated (08/18/2022)   Social Connection and Isolation Panel [NHANES]    Frequency of Communication with Friends and Family: More than three times a week    Frequency of Social Gatherings with Friends and Family: Once a week    Attends Religious Services: Never    Marine scientist or Organizations: Yes    Attends Music therapist: More than 4 times per year    Marital Status: Divorced    Tobacco Counseling Counseling given: Not Answered   Clinical Intake:  Pre-visit preparation completed: Yes  Pain : No/denies pain  Diabetes: No  How often do you need to have someone help you when you read instructions, pamphlets, or other written materials from your doctor or pharmacy?: 1 - Never   Activities of Daily Living    08/18/2022    3:08 PM  In your present state of health, do you have any difficulty performing the following activities:  Hearing? 0  Vision? 1  Comment dry macular degeneration in right eye  Difficulty concentrating or making decisions? 0  Walking or climbing stairs? 1  Dressing or bathing? 0  Doing errands,  shopping? 0  Preparing Food and eating ? N  Using the Toilet? N  In the past six months, have you accidently leaked urine? Y  Do you have problems with loss of bowel control? N  Managing your Medications? N  Managing your Finances? N  Housekeeping or managing your Housekeeping? N    Patient Care Team: Marrian Salvage, FNP as PCP - General (Internal Medicine)  Indicate any recent Medical Services you may have received from other than [facility] providers in the past year (date may be approximate).     Assessment:   This is a routine wellness examination for [place].  Hearing/Vision screen  No results found.  Dietary issues and exercise activities discussed: Current Exercise Habits: Home exercise routine, Type of exercise: calisthenics;yoga;walking, Time (Minutes): 40, Frequency (Times/Week): 5, Weekly Exercise (Minutes/Week): 200, Intensity: Mild, Exercise limited by: orthopedic condition(s)   Goals Addressed   None    Depression Screen    08/18/2022    3:08 PM 11/21/2021    3:05 PM 11/21/2021    3:01 PM 07/18/2021    1:54 PM  PHQ 2/9 Scores  PHQ - 2 Score 0 0 0 0  PHQ- 9 Score  0      Fall Risk    08/18/2022    3:02 PM 11/21/2021    3:01 PM 07/18/2021    1:54 PM  [place] in the past year? 0 0 0  Number falls in past yr: 0 0 0  Injury with Fall? 0 0 0  Risk for fall due to : Impaired balance/gait No Fall Risks   Follow up Falls evaluation completed Falls evaluation completed     [place]:  Any stairs in or around the home? No  Home free of loose throw rugs in walkways, pet beds, electrical cords, etc? Yes  Adequate lighting in your home to reduce risk of falls? Yes   ASSISTIVE DEVICES UTILIZED TO PREVENT FALLS:  Life alert?  Wears Apple watch  Use of a cane, walker or w/c? Yes  Grab bars in the bathroom? Yes  Shower chair or bench in shower? No  Elevated toilet seat or a handicapped toilet? No   TIMED UP AND  GO:  Was the test performed?  No, audio visit .    Cognitive Function:        08/18/2022    3:18 PM  6CIT Screen  What Year? 0 points  What month? 0 points  What time? 0 points  Count back from 20 0 points  Months in reverse 0 points  Repeat phrase 0 points  Total Score 0 points    Immunizations Immunization History  Administered Date(s) Administered   Influenza Split 04/27/2012   Influenza,inj,Quad PF,6+ Mos 05/10/2020, 04/01/2021   Influenza-Unspecified 04/27/2012, 03/19/2021, 04/20/2022   Janssen (J&J) SARS-COV-2 Vaccination 09/16/2019   PPD Test 08/17/2013   Tdap 06/08/2008    TDAP status: Due, Education has been provided regarding the importance of this vaccine. Advised may receive this vaccine at local pharmacy or Health Dept. Aware to provide a copy of the vaccination record if obtained from local pharmacy or Health Dept. Verbalized acceptance and understanding.  Flu Vaccine status: Up to date  Covid-19 vaccine status: Information provided on how to obtain vaccines.   Qualifies for Shingles Vaccine? Yes   Zostavax completed No   Shingrix Completed?: No.    Education has been provided regarding the importance of this vaccine. Patient has been advised to call insurance company to determine out of pocket expense if they have not yet received this vaccine. Advised may also receive vaccine at local pharmacy or Health Dept. Verbalized acceptance and understanding.  Screening Tests Health Maintenance  Topic Date Due   Medicare Annual Wellness (AWV)  Never done   HIV Screening  Never done   COLONOSCOPY (Pts 45-23yr Insurance coverage will need to be confirmed)  Never done   Zoster Vaccines- Shingrix (1 of 2) Never done   DTaP/Tdap/Td (2 - Td or Tdap) 06/08/2018   COVID-19 Vaccine (2 - 2023-24 season) 02/06/2022   MAMMOGRAM  05/12/2024   PAP SMEAR-Modifier  07/18/2024   INFLUENZA VACCINE  Completed   Hepatitis C Screening  Completed   HPV VACCINES  Aged Out     Health Maintenance  Health Maintenance Due  Topic Date Due   Medicare Annual Wellness (AWV)  Never done   HIV Screening  Never done   COLONOSCOPY (Pts 45-62yr Insurance coverage will need to be confirmed)  Never done   Zoster Vaccines- Shingrix (1 of 2) Never done   DTaP/Tdap/Td (2 - Td or Tdap) 06/08/2018   COVID-19 Vaccine (2 - 2023-24 season) 02/06/2022    Colorectal Cancer Screen:  Pr prefers to pick up IFOB at next OV  Mammogram status: Completed 05/12/22. Repeat every year  Lung Cancer Screening: (Low Dose CT Chest recommended if Age 60-80years, 30 pack-year currently smoking OR have quit w/in 15years.) does not qualify.   Additional Screening:  Hepatitis C Screening: does qualify; Completed 07/28/21  Vision Screening: Recommended annual ophthalmology exams for early detection of glaucoma and other disorders of the eye. Is the patient up to date with their annual eye exam?  Yes  Who is the provider or what is the name of the office in which the patient attends annual eye exams? Dr. PJaquita Rectorspecialist If pt is not established with a provider, would they like to be referred to a provider to establish care? No .   Dental Screening: Recommended annual dental exams for proper oral hygiene  Community Resource Referral / Chronic Care Management: CRR required this visit?  No   CCM required this visit?  No      Plan:     I have personally reviewed and noted the following in the patient's chart:   Medical and social history Use of alcohol, tobacco or illicit drugs  Current medications and supplements including opioid prescriptions. Patient is not currently taking opioid prescriptions. Functional ability and status Nutritional status Physical activity Advanced directives List of other physicians Hospitalizations, surgeries, and ER visits in previous 12 months Vitals Screenings to include cognitive, depression, and falls Referrals and appointments  In  addition, I have reviewed and discussed with patient certain preventive protocols, quality metrics, and best practice recommendations. A written personalized care plan for preventive services as well as general preventive health recommendations were provided to patient.   Due to this being a telephonic visit, the after visit summary with patients personalized plan was offered to patient via mail or my-chart. Patient would like to access on my-chart.  BBeatris Ship [place]  08/18/2022   Nurse Notes: None

## 2022-08-18 NOTE — Patient Instructions (Signed)
Stephanie Hensley , Thank you for taking time to come for your Medicare Wellness Visit. I appreciate your ongoing commitment to your health goals. Please review the following plan we discussed and let me know if I can assist you in the future.   These are the goals we discussed:  Goals   None     This is a list of the screening recommended for you and due dates:  Health Maintenance  Topic Date Due   HIV Screening  Never done   Colon Cancer Screening  Never done   Zoster (Shingles) Vaccine (1 of 2) Never done   DTaP/Tdap/Td vaccine (2 - Td or Tdap) 06/08/2018   COVID-19 Vaccine (2 - 2023-24 season) 02/06/2022   Medicare Annual Wellness Visit  08/18/2023   Mammogram  05/12/2024   Pap Smear  07/18/2024   Flu Shot  Completed   Hepatitis C Screening: USPSTF Recommendation to screen - Ages 18-79 yo.  Completed   HPV Vaccine  Aged Out      Next appointment: Follow up in one year for your annual wellness visit.   Preventive Care 40-64 Years, Female Preventive care refers to lifestyle choices and visits with your health care provider that can promote health and wellness. What does preventive care include? A yearly physical exam. This is also called an annual well check. Dental exams once or twice a year. Routine eye exams. Ask your health care provider how often you should have your eyes checked. Personal lifestyle choices, including: Daily care of your teeth and gums. Regular physical activity. Eating a healthy diet. Avoiding tobacco and drug use. Limiting alcohol use. Practicing safe sex. Taking low-dose aspirin daily starting at age 4. Taking vitamin and mineral supplements as recommended by your health care provider. What happens during an annual well check? The services and screenings done by your health care provider during your annual well check will depend on your age, overall health, lifestyle risk factors, and family history of disease. Counseling  Your health care provider  may ask you questions about your: Alcohol use. Tobacco use. Drug use. Emotional well-being. Home and relationship well-being. Sexual activity. Eating habits. Work and work Statistician. Method of birth control. Menstrual cycle. Pregnancy history. Screening  You may have the following tests or measurements: Height, weight, and BMI. Blood pressure. Lipid and cholesterol levels. These may be checked every 5 years, or more frequently if you are over 77 years old. Skin check. Lung cancer screening. You may have this screening every year starting at age 107 if you have a 30-pack-year history of smoking and currently smoke or have quit within the past 15 years. Fecal occult blood test (FOBT) of the stool. You may have this test every year starting at age 25. Flexible sigmoidoscopy or colonoscopy. You may have a sigmoidoscopy every 5 years or a colonoscopy every 10 years starting at age 81. Hepatitis C blood test. Hepatitis B blood test. Sexually transmitted disease (STD) testing. Diabetes screening. This is done by checking your blood sugar (glucose) after you have not eaten for a while (fasting). You may have this done every 1-3 years. Mammogram. This may be done every 1-2 years. Talk to your health care provider about when you should start having regular mammograms. This may depend on whether you have a family history of breast cancer. BRCA-related cancer screening. This may be done if you have a family history of breast, ovarian, tubal, or peritoneal cancers. Pelvic exam and Pap test. This may be done every  3 years starting at age 99. Starting at age 41, this may be done every 5 years if you have a Pap test in combination with an HPV test. Bone density scan. This is done to screen for osteoporosis. You may have this scan if you are at high risk for osteoporosis. Discuss your test results, treatment options, and if necessary, the need for more tests with your health care provider. Vaccines   Your health care provider may recommend certain vaccines, such as: Influenza vaccine. This is recommended every year. Tetanus, diphtheria, and acellular pertussis (Tdap, Td) vaccine. You may need a Td booster every 10 years. Zoster vaccine. You may need this after age 72. Pneumococcal 13-valent conjugate (PCV13) vaccine. You may need this if you have certain conditions and were not previously vaccinated. Pneumococcal polysaccharide (PPSV23) vaccine. You may need one or two doses if you smoke cigarettes or if you have certain conditions. Talk to your health care provider about which screenings and vaccines you need and how often you need them. This information is not intended to replace advice given to you by your health care provider. Make sure you discuss any questions you have with your health care provider. Document Released: 06/21/2015 Document Revised: 02/12/2016 Document Reviewed: 03/26/2015 Elsevier Interactive Patient Education  2017 [place] Prevention in the Home Falls can cause injuries. They can happen to people of all ages. There are many things you can do to make your home safe and to help prevent falls. What can I do on the outside of my home? Regularly fix the edges of walkways and driveways and fix any cracks. Remove anything that might make you trip as you walk through a door, such as a raised step or threshold. Trim any bushes or trees on the path to your home. Use bright outdoor lighting. Clear any walking paths of anything that might make someone trip, such as rocks or tools. Regularly check to see if handrails are loose or broken. Make sure that both sides of any steps have handrails. Any raised decks and porches should have guardrails on the edges. Have any leaves, snow, or ice cleared regularly. Use sand or salt on walking paths during winter. Clean up any spills in your garage right away. This includes oil or grease spills. What can I do in the  bathroom? Use night lights. Install grab bars by the toilet and in the tub and shower. Do not use towel bars as grab bars. Use non-skid mats or decals in the tub or shower. If you need to sit down in the shower, use a plastic, non-slip stool. Keep the floor dry. Clean up any water that spills on the floor as soon as it happens. Remove soap buildup in the tub or shower regularly. Attach bath mats securely with double-sided non-slip rug tape. Do not have throw rugs and other things on the floor that can make you trip. What can I do in the bedroom? Use night lights. Make sure that you have a light by your bed that is easy to reach. Do not use any sheets or blankets that are too big for your bed. They should not hang down onto the floor. Have a firm chair that has side arms. You can use this for support while you get dressed. Do not have throw rugs and other things on the floor that can make you trip. What can I do in the kitchen? Clean up any spills right away. Avoid walking on wet  floors. Keep items that you use a lot in easy-to-reach places. If you need to reach something above you, use a strong step stool that has a grab bar. Keep electrical cords out of the way. Do not use floor polish or wax that makes floors slippery. If you must use wax, use non-skid floor wax. Do not have throw rugs and other things on the floor that can make you trip. What can I do with my stairs? Do not leave any items on the stairs. Make sure that there are handrails on both sides of the stairs and use them. Fix handrails that are broken or loose. Make sure that handrails are as long as the stairways. Check any carpeting to make sure that it is firmly attached to the stairs. Fix any carpet that is loose or worn. Avoid having throw rugs at the top or bottom of the stairs. If you do have throw rugs, attach them to the floor with carpet tape. Make sure that you have a light switch at the top of the stairs and the  bottom of the stairs. If you do not have them, ask someone to add them for you. What else can I do to help prevent falls? Wear shoes that: Do not have high heels. Have rubber bottoms. Are comfortable and fit you well. Are closed at the toe. Do not wear sandals. If you use a stepladder: Make sure that it is fully opened. Do not climb a closed stepladder. Make sure that both sides of the stepladder are locked into place. Ask someone to hold it for you, if possible. Clearly mark and make sure that you can see: Any grab bars or handrails. First and last steps. Where the edge of each step is. Use tools that help you move around (mobility aids) if they are needed. These include: Canes. Walkers. Scooters. Crutches. Turn on the lights when you go into a dark area. Replace any light bulbs as soon as they burn out. Set up your furniture so you have a clear path. Avoid moving your furniture around. If any of your floors are uneven, fix them. If there are any pets around you, be aware of where they are. Review your medicines with your doctor. Some medicines can make you feel dizzy. This can increase your chance of falling. Ask your doctor what other things that you can do to help prevent falls. This information is not intended to replace advice given to you by your health care provider. Make sure you discuss any questions you have with your health care provider. Document Released: 03/21/2009 Document Revised: 10/31/2015 Document Reviewed: 06/29/2014 Elsevier Interactive Patient Education  2017 [employer].

## 2022-08-25 ENCOUNTER — Other Ambulatory Visit: Payer: Self-pay | Admitting: Family

## 2022-09-08 ENCOUNTER — Ambulatory Visit: Payer: Medicare (Managed Care) | Admitting: Family

## 2022-09-11 ENCOUNTER — Encounter: Payer: Self-pay | Admitting: Family

## 2022-09-11 ENCOUNTER — Ambulatory Visit (INDEPENDENT_AMBULATORY_CARE_PROVIDER_SITE_OTHER): Payer: Medicare (Managed Care) | Admitting: Family

## 2022-09-11 VITALS — BP 122/72 | HR 77 | Resp 18 | Ht 65.0 in | Wt 289.4 lb

## 2022-09-11 DIAGNOSIS — Z8742 Personal history of other diseases of the female genital tract: Secondary | ICD-10-CM

## 2022-09-11 DIAGNOSIS — Z1211 Encounter for screening for malignant neoplasm of colon: Secondary | ICD-10-CM

## 2022-09-11 DIAGNOSIS — R7309 Other abnormal glucose: Secondary | ICD-10-CM | POA: Diagnosis not present

## 2022-09-11 MED ORDER — DICLOFENAC SODIUM 75 MG PO TBEC: 75.0000 mg | DELAYED_RELEASE_TABLET | Freq: Two times a day (BID) | ORAL | 3 refills | Status: DC

## 2022-09-11 MED ORDER — ALBUTEROL SULFATE HFA 108 (90 BASE) MCG/ACT IN AERS: 1.0000 | INHALATION_SPRAY | Freq: Four times a day (QID) | RESPIRATORY_TRACT | 3 refills | Status: DC | PRN

## 2022-09-11 NOTE — Patient Instructions (Signed)
Please consider getting your Shingrix ( Shingles vaccine) and Tdap updated at your pharmacy;

## 2022-09-11 NOTE — Progress Notes (Signed)
Stephanie Hensley is a 62 y.o. female with the following history as recorded in EpicCare:  Patient Active Problem List   Diagnosis Date Noted   Macular degeneration 04/16/2022   ASCUS with positive high risk HPV cervical 09/04/2021   Cutaneous candidiasis 05/10/2020   Asthma 04/19/2020   Depression 04/19/2020   Nuclear sclerotic cataract of right eye 04/19/2020   Epiretinal membrane (ERM) of right eye 04/19/2020   Localized traumatic cataract of right eye 04/19/2020   Post traumatic stress disorder 04/19/2020   Morbid obesity 09/01/2019   Tibia/fibula fracture, right, closed, with nonunion, subsequent encounter 07/06/2019   Closed 4-part fracture of proximal humerus, left, initial encounter 06/18/2016    Current Outpatient Medications  Medication Sig Dispense Refill   acetaminophen (TYLENOL) 500 MG tablet Take 500 mg by mouth every 6 (six) hours as needed.     Calcium-Magnesium-Vitamin D (CITRACAL SLOW RELEASE PO) Take by mouth.     cetirizine (ZYRTEC) 10 MG tablet Take 10 mg by mouth daily.     Evolocumab (REPATHA SURECLICK) 140 MG/ML SOAJ Inject 140 mg into the skin every 14 (fourteen) days. 2 mL 11   ezetimibe (ZETIA) 10 MG tablet Take 1 tablet (10 mg total) by mouth daily. 90 tablet 3   Ferrous Sulfate (SLOW RELEASE IRON PO) Take by mouth once a week.     hydrochlorothiazide (MICROZIDE) 12.5 MG capsule TAKE 1 CAPSULE(12.5 MG) BY MOUTH DAILY 90 capsule 3   Magnesium 250 MG TABS Take by mouth.     Multiple Vitamins-Minerals (ALIVE WOMENS 50+) CHEW Chew by mouth.     TURMERIC-GINGER PO Take by mouth.     albuterol (VENTOLIN HFA) 108 (90 Base) MCG/ACT inhaler Inhale 1-2 puffs into the lungs every 6 (six) hours as needed for wheezing or shortness of breath. 1 each 3   diclofenac (VOLTAREN) 75 MG EC tablet Take 1 tablet (75 mg total) by mouth 2 (two) times daily. 60 tablet 3   No current facility-administered medications for this visit.    Allergies: Crestor [rosuvastatin] and Lipitor  [atorvastatin]  Past Medical History:  Diagnosis Date   Asthma    Headache    migraines in the past   PTSD (post-traumatic stress disorder)     Past Surgical History:  Procedure Laterality Date   CHOLECYSTECTOMY     HERNIA REPAIR     umbilical hernia repair   KNEE SURGERY     SHOULDER HEMI-ARTHROPLASTY Left 06/18/2016   Procedure: LEFT SHOULDER HEMI-ARTHROPLASTY;  Surgeon: Yolonda Kida, MD;  Location: [facility];  Service: Orthopedics;  Laterality: Left;   TUBAL LIGATION      Family History  Problem Relation Age of Onset   Alcohol abuse Mother    Cancer Mother    Alcohol abuse Father    Heart disease Father    COPD Sister    Arthritis Sister    Alcohol abuse Sister    Mental illness Sister    Heart disease Brother     Social History   Tobacco Use   Smoking status: Former    Types: Cigarettes    Quit date: 06/17/2012    Years since quitting: 10.2   Smokeless tobacco: Never  Substance Use Topics   Alcohol use: Yes    Comment: Rare    Subjective:   Follow up on chronic care needs; needs to get established with new GYN group; needs to get colon cancer screen updated; needs labs updated;     Objective:  Vitals:   09/11/22  1334  BP: 122/72  Pulse: 77  Resp: 18  SpO2: 98%  Weight: 289 lb 6.4 oz (131.3 kg)  Height: 5\' 5"  (1.651 m)    General: Well developed, well nourished, in no acute distress  Skin : Warm and dry.  Head: Normocephalic and atraumatic  Eyes: Sclera and conjunctiva clear; pupils round and reactive to light; extraocular movements intact  Ears: External normal; canals clear; tympanic membranes normal  Oropharynx: Pink, supple. No suspicious lesions  Neck: Supple without thyromegaly, adenopathy  Lungs: Respirations unlabored; clear to auscultation bilaterally without wheeze, rales, rhonchi  CVS exam: normal rate and regular rhythm.  Neurologic: Alert and oriented; speech intact; face symmetrical; moves all extremities well; CNII-XII intact  without focal deficit   Assessment:  1. Elevated glucose   2. Colon cancer screening   3. History of abnormal cervical Pap smear     Plan:  Update labs today including CBC, CMP, hgba1c; follow up to be determined; history of pre-diabetes; Order updated for Cologuard; Refer to GYN to establish care;   No follow-ups on file.  Orders Placed This Encounter  Procedures   Cologuard   CBC with Differential/Platelet   Comp Met (CMET)   Hemoglobin A1c   Ambulatory referral to Obstetrics / Gynecology    Referral Priority:   Routine    Referral Type:   Consultation    Referral Reason:   Specialty Services Required    Requested Specialty:   Obstetrics and Gynecology    Number of Visits Requested:   1    Requested Prescriptions   Signed Prescriptions Disp Refills   albuterol (VENTOLIN HFA) 108 (90 Base) MCG/ACT inhaler 1 each 3    Sig: Inhale 1-2 puffs into the lungs every 6 (six) hours as needed for wheezing or shortness of breath.   diclofenac (VOLTAREN) 75 MG EC tablet 60 tablet 3    Sig: Take 1 tablet (75 mg total) by mouth 2 (two) times daily.

## 2022-09-12 LAB — CBC WITH DIFFERENTIAL/PLATELET
Absolute Monocytes: 545 cells/uL (ref 200–950)
Basophils Absolute: 61 cells/uL (ref 0–200)
Basophils Relative: 0.6 %
Eosinophils Absolute: 333 cells/uL (ref 15–500)
Eosinophils Relative: 3.3 %
HCT: 36.7 % (ref 35.0–45.0)
Hemoglobin: 12.1 g/dL (ref 11.7–15.5)
Lymphs Abs: 3545 cells/uL (ref 850–3900)
MCH: 29.6 pg (ref 27.0–33.0)
MCHC: 33 g/dL (ref 32.0–36.0)
MCV: 89.7 fL (ref 80.0–100.0)
MPV: 11.1 fL (ref 7.5–12.5)
Monocytes Relative: 5.4 %
Neutro Abs: 5616 cells/uL (ref 1500–7800)
Neutrophils Relative %: 55.6 %
Platelets: 283 10*3/uL (ref 140–400)
RBC: 4.09 10*6/uL (ref 3.80–5.10)
RDW: 12.4 % (ref 11.0–15.0)
Total Lymphocyte: 35.1 %
WBC: 10.1 10*3/uL (ref 3.8–10.8)

## 2022-09-12 LAB — HEMOGLOBIN A1C
Hgb A1c MFr Bld: 5.9 % of total Hgb — ABNORMAL HIGH (ref <5.7)
Mean Plasma Glucose: 123 mg/dL
eAG (mmol/L): 6.8 mmol/L

## 2022-09-12 LAB — COMPREHENSIVE METABOLIC PANEL
AG Ratio: 1.5 (calc) (ref 1.0–2.5)
ALT: 10 U/L (ref 6–29)
AST: 23 U/L (ref 10–35)
Albumin: 4.3 g/dL (ref 3.6–5.1)
Alkaline phosphatase (APISO): 91 U/L (ref 37–153)
BUN/Creatinine Ratio: 21 (calc) (ref 6–22)
BUN: 25 mg/dL (ref 7–25)
CO2: 25 mmol/L (ref 20–32)
Calcium: 9.5 mg/dL (ref 8.6–10.4)
Chloride: 104 mmol/L (ref 98–110)
Creat: 1.21 mg/dL — ABNORMAL HIGH (ref 0.50–1.03)
Globulin: 2.8 g/dL (calc) (ref 1.9–3.7)
Glucose, Bld: 92 mg/dL (ref 65–99)
Potassium: 4.4 mmol/L (ref 3.5–5.3)
Sodium: 139 mmol/L (ref 135–146)
Total Bilirubin: 0.4 mg/dL (ref 0.2–1.2)
Total Protein: 7.1 g/dL (ref 6.1–8.1)

## 2022-09-21 ENCOUNTER — Encounter: Payer: Self-pay | Admitting: Pharmacist

## 2022-09-22 ENCOUNTER — Other Ambulatory Visit: Payer: Self-pay | Admitting: Pharmacist

## 2022-09-22 DIAGNOSIS — E7800 Pure hypercholesterolemia, unspecified: Secondary | ICD-10-CM

## 2022-09-22 DIAGNOSIS — I2510 Atherosclerotic heart disease of native coronary artery without angina pectoris: Secondary | ICD-10-CM

## 2022-09-22 MED ORDER — REPATHA SURECLICK 140 MG/ML ~~LOC~~ SOAJ: 140.0000 mg | SUBCUTANEOUS | 0 refills | Status: DC

## 2022-10-09 ENCOUNTER — Telehealth: Payer: Self-pay | Admitting: Family

## 2022-10-09 NOTE — Telephone Encounter (Signed)
Patient called stating her insurance is billing her 25 dollars because her OV on 09/11/22 was submitted wrong. She stated that per her insurance, they cannot have a NP and a PCP so Vernona Rieger was supposed to submit the OV with Dr. Cyndie Chime NPI number. She stated that last year it was done correctly, but it was messed up this time. Please advise.

## 2022-10-11 DIAGNOSIS — Z1211 Encounter for screening for malignant neoplasm of colon: Secondary | ICD-10-CM | POA: Diagnosis not present

## 2022-10-19 ENCOUNTER — Other Ambulatory Visit: Payer: Self-pay | Admitting: Cardiology

## 2022-10-19 DIAGNOSIS — E7800 Pure hypercholesterolemia, unspecified: Secondary | ICD-10-CM

## 2022-10-19 DIAGNOSIS — I2510 Atherosclerotic heart disease of native coronary artery without angina pectoris: Secondary | ICD-10-CM

## 2022-10-22 ENCOUNTER — Other Ambulatory Visit: Payer: Self-pay | Admitting: Family

## 2022-10-22 DIAGNOSIS — R195 Other fecal abnormalities: Secondary | ICD-10-CM

## 2022-10-22 LAB — COLOGUARD: COLOGUARD: POSITIVE — AB

## 2022-10-23 ENCOUNTER — Telehealth: Payer: Self-pay

## 2022-10-23 NOTE — Telephone Encounter (Signed)
-----   Message from Olive Bass, FNP sent at 10/22/2022  2:03 PM EDT ----- Her Cologuard was positive- she will need to get follow up colonoscopy; order updated;

## 2022-10-26 DIAGNOSIS — H35363 Drusen (degenerative) of macula, bilateral: Secondary | ICD-10-CM | POA: Diagnosis not present

## 2022-10-26 DIAGNOSIS — H25812 Combined forms of age-related cataract, left eye: Secondary | ICD-10-CM | POA: Diagnosis not present

## 2022-10-26 DIAGNOSIS — Z961 Presence of intraocular lens: Secondary | ICD-10-CM | POA: Diagnosis not present

## 2022-10-26 DIAGNOSIS — H35453 Secondary pigmentary degeneration, bilateral: Secondary | ICD-10-CM | POA: Diagnosis not present

## 2022-10-26 DIAGNOSIS — H43811 Vitreous degeneration, right eye: Secondary | ICD-10-CM | POA: Diagnosis not present

## 2022-10-26 DIAGNOSIS — H26491 Other secondary cataract, right eye: Secondary | ICD-10-CM | POA: Diagnosis not present

## 2022-10-26 DIAGNOSIS — H35371 Puckering of macula, right eye: Secondary | ICD-10-CM | POA: Diagnosis not present

## 2022-10-26 DIAGNOSIS — H353131 Nonexudative age-related macular degeneration, bilateral, early dry stage: Secondary | ICD-10-CM | POA: Diagnosis not present

## 2022-10-28 ENCOUNTER — Encounter: Payer: Self-pay | Admitting: Gastroenterology

## 2022-11-10 DIAGNOSIS — H26491 Other secondary cataract, right eye: Secondary | ICD-10-CM | POA: Diagnosis not present

## 2022-11-11 ENCOUNTER — Encounter: Payer: Self-pay | Admitting: Obstetrics & Gynecology

## 2022-11-11 ENCOUNTER — Ambulatory Visit (INDEPENDENT_AMBULATORY_CARE_PROVIDER_SITE_OTHER): Payer: Medicare (Managed Care) | Admitting: Obstetrics & Gynecology

## 2022-11-11 ENCOUNTER — Other Ambulatory Visit (HOSPITAL_COMMUNITY)
Admission: RE | Admit: 2022-11-11 | Discharge: 2022-11-11 | Disposition: A | Discharge status: Home / Self Care | Payer: Medicare (Managed Care) | Source: Ambulatory Visit | Attending: Obstetrics & Gynecology | Admitting: Obstetrics & Gynecology

## 2022-11-11 VITALS — BP 120/85 | HR 79 | Wt 277.0 lb

## 2022-11-11 DIAGNOSIS — R87810 Cervical high risk human papillomavirus (HPV) DNA test positive: Secondary | ICD-10-CM

## 2022-11-11 DIAGNOSIS — Z8742 Personal history of other diseases of the female genital tract: Secondary | ICD-10-CM | POA: Diagnosis not present

## 2022-11-11 DIAGNOSIS — Z1339 Encounter for screening examination for other mental health and behavioral disorders: Secondary | ICD-10-CM | POA: Diagnosis not present

## 2022-11-11 DIAGNOSIS — Z1151 Encounter for screening for human papillomavirus (HPV): Secondary | ICD-10-CM | POA: Diagnosis not present

## 2022-11-11 DIAGNOSIS — Z01419 Encounter for gynecological examination (general) (routine) without abnormal findings: Secondary | ICD-10-CM

## 2022-11-11 DIAGNOSIS — R87610 Atypical squamous cells of undetermined significance on cytologic smear of cervix (ASC-US): Secondary | ICD-10-CM

## 2022-11-11 DIAGNOSIS — N393 Stress incontinence (female) (male): Secondary | ICD-10-CM

## 2022-11-11 NOTE — Progress Notes (Signed)
Subjective:     Stephanie Hensley is a 62 y.o. female here for a routine exam.  Current complaints: [id number].  Prev abnormal PAP. No FH breast or female cancers. Prev abnoral PAP and colpo.  Pt reports intermittent stress incontinence. Interested in further management at present.    Gynecologic History Patient's last menstrual period was 12/24/2010. Contraception: post menopausal status Last Pap: 07/18/2021. Results were:abnormal: ASCUS + hrHPV 09/04/2021 Clinical History: ASCUS with positive high risk HPV (cm)  FINAL MICROSCOPIC DIAGNOSIS:   A. CERVIX, 11 AND 9 O'CLOCK, BIOPSY:  - Koilocytic atypia, consistent with low-grade squamous intraepithelial  lesion (CIN1, low-grade dysplasia)   Last mammogram: 05/12/2022. Results were: normal  Obstetric History OB History  Gravida Para Term Preterm AB Living  3 2 2     2   SAB IAB Ectopic Multiple Live Births          2    # Outcome Date GA Lbr Len/2nd Weight Sex Delivery Anes PTL Lv  3 Term 05/30/86    M Vag-Spont None    2 Term 07/02/85    M Vag-Spont EPI    1 Gravida              The following portions of the patient's history were reviewed and updated as appropriate: allergies, current medications, past family history, past medical history, past social history, past surgical history, and problem list.  Review of Systems Pertinent items are noted in HPI.    Objective:  BP 120/85   Pulse 79   Wt 277 lb (125.6 kg)   LMP 12/24/2010   BMI 46.10 kg/m  General Appearance:    Alert, cooperative, no distress, appears stated age  Head:    Normocephalic, without obvious abnormality, atraumatic  Eyes:    conjunctiva/corneas clear, EOM's intact, both eyes  Ears:    Normal external ear canals, both ears  Nose:   Nares normal, septum midline, mucosa normal, no drainage    or sinus tenderness  Throat:   Lips, mucosa, and tongue normal; teeth and gums normal  Neck:   Supple, symmetrical, trachea midline, no adenopathy;    thyroid:  no  enlargement/tenderness/nodules  Back:     Symmetric, no curvature, ROM normal, no CVA tenderness  Lungs:     respirations unlabored  Chest Wall:    No tenderness or deformity   Heart:    Regular rate and rhythm  Breast Exam:    Pt declined breast exam  Abdomen:     Soft, non-tender, bowel sounds active all four quadrants,    no masses, no organomegaly  Genitalia:    Normal female without lesion, discharge or tenderness   No prolapse noted. No leakage with valsalva.   Extremities:   Extremities normal, atraumatic, no cyanosis or edema  Pulses:   2+ and symmetric all extremities  Skin:   Skin color, texture, turgor normal, no rashes or lesions      Assessment:    Healthy female exam.    Plan:   Stephanie Hensley was seen today for gynecologic exam.  Diagnoses and all orders for this visit:  Well female exam with routine gynecological exam -     Cytology - PAP  History of abnormal cervical Pap smear  ASCUS with positive high risk HPV cervical  Stress incontinence, female -     Ambulatory referral to Physical Therapy  F/u in 1 year or sooner prn   Lyrik Buresh L. Harraway-Smith, M.D., [place]

## 2022-11-18 ENCOUNTER — Ambulatory Visit (AMBULATORY_SURGERY_CENTER): Payer: Medicare (Managed Care) | Admitting: *Deleted

## 2022-11-18 VITALS — Ht 65.0 in | Wt 275.0 lb

## 2022-11-18 DIAGNOSIS — R195 Other fecal abnormalities: Secondary | ICD-10-CM

## 2022-11-18 LAB — CYTOLOGY - PAP
Comment: NEGATIVE
Diagnosis: NEGATIVE
Diagnosis: REACTIVE
High risk HPV: NEGATIVE

## 2022-11-18 MED ORDER — NA SULFATE-K SULFATE-MG SULF 17.5-3.13-1.6 GM/177ML PO SOLN: 1.0000 | Freq: Once | ORAL | 0 refills | Status: AC

## 2022-11-18 NOTE — Progress Notes (Signed)
Pt's name and DOB verified at the beginning of the pre-visit.  Pt denies any difficulty with ambulating,sitting, laying down or rolling side to side Gave both [facility] main # and MD on call # prior to instructions.  No egg or soy allergy known to patient  No issues known to pt with past sedation with any surgeries or procedures Pt denies having issues being intubated Pt has no issues moving head neck or swallowing No FH of Malignant Hyperthermia Pt is not on diet pills Pt is not on home 02  Pt is not on blood thinners  Pt denies issues with constipation  Pt is not on dialysis Pt denise any abnormal heart rhythms  Pt denies any upcoming cardiac testing Pt encouraged to use to use Singlecare or Goodrx to reduce cost  Patient's chart reviewed by Cathlyn Parsons CNRA prior to pre-visit and patient appropriate for the [facility].  Pre-visit completed and red dot placed by patient's name on their procedure day (on provider's schedule).  . Visit by phone Pt states weight is 275 Instructed pt why it is important to and  to call if they have any changes in health or new medications. Directed them to the # given and on instructions.   Pt states they will.  Instructions reviewed with pt and pt states understanding. Instructed to review again prior to procedure. Pt states they will.  Instructions sent by mail with coupon and by my chart

## 2022-11-24 ENCOUNTER — Encounter: Payer: Self-pay | Admitting: Obstetrics & Gynecology

## 2022-11-24 ENCOUNTER — Encounter: Payer: Self-pay | Admitting: Gastroenterology

## 2022-11-26 ENCOUNTER — Encounter: Payer: Self-pay | Admitting: Gastroenterology

## 2022-12-04 ENCOUNTER — Encounter: Payer: Self-pay | Admitting: Certified Registered Nurse Anesthetist

## 2022-12-14 ENCOUNTER — Encounter: Payer: Self-pay | Admitting: Gastroenterology

## 2022-12-14 ENCOUNTER — Ambulatory Visit (AMBULATORY_SURGERY_CENTER): Payer: Medicare (Managed Care) | Admitting: Gastroenterology

## 2022-12-14 VITALS — BP 119/64 | HR 65 | Temp 96.8°F | Resp 10 | Ht 65.0 in | Wt 275.0 lb

## 2022-12-14 DIAGNOSIS — R195 Other fecal abnormalities: Secondary | ICD-10-CM | POA: Diagnosis not present

## 2022-12-14 DIAGNOSIS — Z1211 Encounter for screening for malignant neoplasm of colon: Secondary | ICD-10-CM

## 2022-12-14 DIAGNOSIS — D125 Benign neoplasm of sigmoid colon: Secondary | ICD-10-CM

## 2022-12-14 DIAGNOSIS — J45909 Unspecified asthma, uncomplicated: Secondary | ICD-10-CM | POA: Diagnosis not present

## 2022-12-14 DIAGNOSIS — F4310 Post-traumatic stress disorder, unspecified: Secondary | ICD-10-CM | POA: Diagnosis not present

## 2022-12-14 MED ORDER — SODIUM CHLORIDE 0.9 % IV SOLN: 500.0000 mL | INTRAVENOUS | Status: DC

## 2022-12-14 NOTE — Patient Instructions (Signed)
YOU HAD AN ENDOSCOPIC PROCEDURE TODAY AT THE [facility]:   Refer to the procedure report that was given to you for any specific questions about what was found during the examination.  If the procedure report does not answer your questions, please call your gastroenterologist to clarify.  If you requested that your care partner not be given the details of your procedure findings, then the procedure report has been included in a sealed envelope for you to review at your convenience later.  YOU SHOULD EXPECT: Some feelings of bloating in the abdomen. Passage of more gas than usual.  Walking can help get rid of the air that was put into your GI tract during the procedure and reduce the bloating. If you had a lower endoscopy (such as a colonoscopy or flexible sigmoidoscopy) you may notice spotting of blood in your stool or on the toilet paper. If you underwent a bowel prep for your procedure, you may not have a normal bowel movement for a few days.  Please Note:  You might notice some irritation and congestion in your nose or some drainage.  This is from the oxygen used during your procedure.  There is no need for concern and it should clear up in a day or so.  SYMPTOMS TO REPORT IMMEDIATELY:  Following lower endoscopy (colonoscopy or flexible sigmoidoscopy):  Excessive amounts of blood in the stool  Significant tenderness or worsening of abdominal pains  Swelling of the abdomen that is new, acute  Fever of 100F or higher   For urgent or emergent issues, a gastroenterologist can be reached at any hour by calling [phone]. Do not use MyChart messaging for urgent concerns.    DIET:  We do recommend a small meal at first, but then you may proceed to your regular diet.  Drink plenty of fluids but you should avoid alcoholic beverages for 24 hours.  MEDICATIONS: Continue present medications. NO Ibuprofen, Naproxen, or other non-steroidal anti-inflammatory drugs for 2 weeks after polyp  removal  FOLLOW UP: Await pathology results.  Please see handouts given to you by your recovery nurse.  Thank you for allowing [place] to provide for your healthcare needs today.  ACTIVITY:  You should plan to take it easy for the rest of today and you should NOT DRIVE or use heavy machinery until tomorrow (because of the sedation medicines used during the test).    FOLLOW UP: Our staff will call the number listed on your records the next business day following your procedure.  We will call around 7:15- 8:00 am to check on you and address any questions or concerns that you may have regarding the information given to you following your procedure. If we do not reach you, we will leave a message.     If any biopsies were taken you will be contacted by phone or by letter within the next 1-3 weeks.  Please call us at [phone] if you have not heard about the biopsies in 3 weeks.    SIGNATURES/CONFIDENTIALITY: You and/or your care partner have signed paperwork which will be entered into your electronic medical record.  These signatures attest to the fact that that the information above on your After Visit Summary has been reviewed and is understood.  Full responsibility of the confidentiality of this discharge information lies with you and/or your care-partner.

## 2022-12-14 NOTE — Progress Notes (Signed)
Pt's states no medical or surgical changes since previsit or office visit. 

## 2022-12-14 NOTE — Op Note (Signed)
[facility] Patient Name: Stephanie Hensley Procedure Date: 12/14/2022 7:49 AM MRN: [id number] Endoscopist: Viviann Spare P. [name] , MD, [id number] Age: 62 Referring MD:  Date of Birth: Mar 25, 1961 Gender: Female Account #: [id number] Procedure:                Colonoscopy Indications:              Positive Cologuard test - last colonoscopy 20 years                            or so Medicines:                Monitored Anesthesia Care Procedure:                Pre-Anesthesia Assessment:                           - Prior to the procedure, a History and Physical                            was performed, and patient medications and                            allergies were reviewed. The patient's tolerance of                            previous anesthesia was also reviewed. The risks                            and benefits of the procedure and the sedation                            options and risks were discussed with the patient.                            All questions were answered, and informed consent                            was obtained. Prior Anticoagulants: The patient has                            taken no anticoagulant or antiplatelet agents. ASA                            Grade Assessment: III - A patient with severe                            systemic disease. After reviewing the risks and                            benefits, the patient was deemed in satisfactory                            condition to undergo the procedure.  After obtaining informed consent, the colonoscope                            was passed under direct vision. Throughout the                            procedure, the patient's blood pressure, pulse, and                            oxygen saturations were monitored continuously. The                            Olympus CF-HQ190L [id number]) Colonoscope was                            introduced through the anus and advanced to  the the                            cecum, identified by appendiceal orifice and                            ileocecal valve. The colonoscopy was performed                            without difficulty. The patient tolerated the                            procedure well. The quality of the bowel                            preparation was good. The ileocecal valve,                            appendiceal orifice, and rectum were photographed. Scope In: 7:56:50 AM Scope Out: 8:11:32 AM Scope Withdrawal Time: 0 hours 12 minutes 2 seconds  Total Procedure Duration: 0 hours 14 minutes 42 seconds  Findings:                 The perianal and digital rectal examinations were                            normal.                           There was a medium-sized lipoma, in the sigmoid                            colon and in the cecum.                           Multiple small-mouthed diverticula were found in                            the left colon and right colon.  A 10 mm polyp was found in the sigmoid colon. The                            polyp was pedunculated. The polyp was removed with                            a hot snare. Resection and retrieval were complete.                           Internal hemorrhoids were found during                            retroflexion. The hemorrhoids were small.                           The exam was otherwise without abnormality. Complications:            No immediate complications. Estimated blood loss:                            Minimal. Estimated Blood Loss:     Estimated blood loss was minimal. Impression:               - Medium-sized lipoma in the sigmoid colon and in                            the cecum.                           - Diverticulosis in the left colon and in the right                            colon.                           - One 10 mm polyp in the sigmoid colon, removed                            with a hot  snare. Resected and retrieved.                           - Internal hemorrhoids.                           - The examination was otherwise normal. Recommendation:           - Patient has a contact number available for                            emergencies. The signs and symptoms of potential                            delayed complications were discussed with the                            patient. Return to normal  activities tomorrow.                            Written discharge instructions were provided to the                            patient.                           - Resume previous diet.                           - Continue present medications.                           - Await pathology results.                           - No ibuprofen, naproxen, or other non-steroidal                            anti-inflammatory drugs for 2 weeks after polyp                            removal. Viviann Spare P. Malcomb Gangemi, MD 12/14/2022 8:17:44 AM This report has been signed electronically.

## 2022-12-14 NOTE — Progress Notes (Signed)
[facility] History and Physical   Primary Care Physician:  Stephanie Bass, FNP   Reason for Procedure:   Positive Cologuard test  Plan:    colonoscopy     HPI: Stephanie Hensley is a 62 y.o. female  here for colonoscopy to evaluate positive Cologuard test. Stool test positive 10/11/22.Last exam 20 years ago. She has no bowel symptoms that bother her.  No family history of colon cancer known. Otherwise feels well without any cardiopulmonary symptoms.   I have discussed risks / benefits of anesthesia and endoscopic procedure with Stephanie Hensley and they wish to proceed with the exams as outlined today.    Past Medical History:  Diagnosis Date   Arthritis    Asthma    Cataract    Bilteral removed   Headache    migraines in the past   Hyperlipidemia    Morbid (severe) obesity due to excess calories (HCC)    PTSD (post-traumatic stress disorder)     Past Surgical History:  Procedure Laterality Date   cataractremopval     CHOLECYSTECTOMY     HERNIA REPAIR     umbilical hernia repair   KNEE SURGERY     KNEE SURGERY Left 2002   Right leg surgery  2021   SHOULDER HEMI-ARTHROPLASTY Left 06/18/2016   Procedure: LEFT SHOULDER HEMI-ARTHROPLASTY;  Surgeon: Stephanie Kida, MD;  Location: [facility] OR;  Service: Orthopedics;  Laterality: Left;   TUBAL LIGATION      Prior to Admission medications   Medication Sig Start Date End Date Taking? Authorizing Provider  acetaminophen (TYLENOL) 500 MG tablet Take 500 mg by mouth every 6 (six) hours as needed.   Yes [provider]  albuterol (VENTOLIN HFA) 108 (90 Base) MCG/ACT inhaler Inhale 1-2 puffs into the lungs every 6 (six) hours as needed for wheezing or shortness of breath. 09/11/22  Yes Stephanie Bass, FNP  Calcium-Magnesium-Vitamin D (CITRACAL SLOW RELEASE PO) Take by mouth.   Yes [provider]  cetirizine (ZYRTEC) 10 MG tablet Take 10 mg by mouth daily.   Yes [provider]   diclofenac (VOLTAREN) 75 MG EC tablet Take 1 tablet (75 mg total) by mouth 2 (two) times daily. 09/11/22  Yes Stephanie Bass, FNP  ezetimibe (ZETIA) 10 MG tablet Take 1 tablet (10 mg total) by mouth daily. 01/02/22 12/28/22 Yes Stephanie Bunting, MD  hydrochlorothiazide (MICROZIDE) 12.5 MG capsule TAKE 1 CAPSULE(12.5 MG) BY MOUTH DAILY 08/18/22  Yes Stephanie Bass, FNP  Multiple Vitamins-Minerals (ALIVE WOMENS 50+) CHEW Chew by mouth.   Yes [provider]  nystatin ointment (MYCOSTATIN) Apply 1 Application topically daily as needed. 12/04/21  Yes [provider]  diclofenac Sodium (VOLTAREN) 1 % GEL Apply 2 g topically 4 (four) times daily. 09/15/22   [provider]  Evolocumab (REPATHA SURECLICK) 140 MG/ML SOAJ INJECT 140 MG INTO THE SKIN EVERY 14 DAYS Patient not taking: Reported on 12/14/2022 10/19/22   Stephanie Bunting, MD  Ferrous Sulfate (SLOW RELEASE IRON PO) Take by mouth once a week.    [provider]  Magnesium 250 MG TABS Take by mouth.    [provider]  TURMERIC-GINGER PO Take by mouth.    [provider]    Current Outpatient Medications  Medication Sig Dispense Refill   acetaminophen (TYLENOL) 500 MG tablet Take 500 mg by mouth every 6 (six) hours as needed.     albuterol (VENTOLIN HFA) 108 (90 Base) MCG/ACT inhaler Inhale 1-2  puffs into the lungs every 6 (six) hours as needed for wheezing or shortness of breath. 1 each 3   Calcium-Magnesium-Vitamin D (CITRACAL SLOW RELEASE PO) Take by mouth.     cetirizine (ZYRTEC) 10 MG tablet Take 10 mg by mouth daily.     diclofenac (VOLTAREN) 75 MG EC tablet Take 1 tablet (75 mg total) by mouth 2 (two) times daily. 60 tablet 3   ezetimibe (ZETIA) 10 MG tablet Take 1 tablet (10 mg total) by mouth daily. 90 tablet 3   hydrochlorothiazide (MICROZIDE) 12.5 MG capsule TAKE 1 CAPSULE(12.5 MG) BY MOUTH DAILY 90 capsule 3   Multiple Vitamins-Minerals (ALIVE WOMENS 50+) CHEW Chew by  mouth.     nystatin ointment (MYCOSTATIN) Apply 1 Application topically daily as needed.     diclofenac Sodium (VOLTAREN) 1 % GEL Apply 2 g topically 4 (four) times daily.     Evolocumab (REPATHA SURECLICK) 140 MG/ML SOAJ INJECT 140 MG INTO THE SKIN EVERY 14 DAYS (Patient not taking: Reported on 12/14/2022) 6 mL 0   Ferrous Sulfate (SLOW RELEASE IRON PO) Take by mouth once a week.     Magnesium 250 MG TABS Take by mouth.     TURMERIC-GINGER PO Take by mouth.     Current Facility-Administered Medications  Medication Dose Route Frequency Provider Last Rate Last Admin   0.9 %  sodium chloride infusion  500 mL Intravenous Continuous Stephanie Hensley, Stephanie Rayas, MD        Allergies as of 12/14/2022 - Review Complete 12/14/2022  Allergen Reaction Noted   Crestor [rosuvastatin]  10/30/2021   Lipitor [atorvastatin]  10/30/2021    Family History  Problem Relation Age of Onset   Stomach cancer Mother    Alcohol abuse Mother    Cancer Mother    Alcohol abuse Father    Heart disease Father    COPD Sister    Arthritis Sister    Alcohol abuse Sister    Mental illness Sister    Heart disease Brother    Colon cancer Neg Hx    Colon polyps Neg Hx    Esophageal cancer Neg Hx    Rectal cancer Neg Hx     Social History   Socioeconomic History   Marital status: Single    Spouse name: Not on file   Number of children: 2   Years of education: Not on file   Highest education level: Not on file  Occupational History   Not on file  Tobacco Use   Smoking status: Former    Types: Cigarettes    Quit date: 06/17/2012    Years since quitting: 10.4   Smokeless tobacco: Never  Substance and Sexual Activity   Alcohol use: Yes    Comment: Rare   Drug use: No   Sexual activity: Not on file  Other Topics Concern   Not on file  Social History Narrative   Not on file   Social Determinants of Health   Financial Resource Strain: Medium Risk (08/18/2022)   Overall Financial Resource Strain (CARDIA)     Difficulty of Paying Living Expenses: Somewhat hard  Food Insecurity: No Food Insecurity (08/18/2022)   Hunger Vital Sign    Worried About Running Out of Food in the Last Year: Never true    Ran Out of Food in the Last Year: Never true  Transportation Needs: No Transportation Needs (08/18/2022)   PRAPARE - Administrator, Civil Service (Medical): No    Lack of Transportation (Non-Medical):  No  Physical Activity: Inactive (08/18/2022)   Exercise Vital Sign    Days of Exercise per Week: 0 days    Minutes of Exercise per Session: 0 min  Stress: Stress Concern Present (08/18/2022)   [employer] of Occupational Health - Occupational Stress Questionnaire    Feeling of Stress : To some extent  Social Connections: Moderately Isolated (08/18/2022)   Social Connection and Isolation Panel [NHANES]    Frequency of Communication with Friends and Family: More than three times a week    Frequency of Social Gatherings with Friends and Family: Once a week    Attends Religious Services: Never    Database administrator or Organizations: Yes    Attends Engineer, structural: More than 4 times per year    Marital Status: Divorced  Intimate Partner Violence: Not At Risk (08/18/2022)   Humiliation, Afraid, Rape, and Kick questionnaire    Fear of Current or Ex-Partner: No    Emotionally Abused: No    Physically Abused: No    Sexually Abused: No    Review of Systems: All other review of systems negative except as mentioned in the HPI.  Physical Exam: Vital signs BP (!) 147/84   Pulse 74   Temp (!) 96.8 F (36 C) (Temporal)   Ht 5\' 5"  (1.651 m)   Wt 275 lb (124.7 kg)   LMP 12/24/2010   BMI 45.76 kg/m   General:   Alert,  Well-developed, pleasant and cooperative in NAD Lungs:  Clear throughout to auscultation.   Heart:  Regular rate and rhythm Abdomen:  Soft, nontender and nondistended.   Neuro/Psych:  Alert and cooperative. Normal mood and affect. A and O x 3  Harlin Rain, MD [facility]

## 2022-12-14 NOTE — Progress Notes (Signed)
0800 Robinul 0.2 mg IV given due large amount of secretions upon assessment.  Patient experiencing nausea and retching.  MD updated and Zofran 4 mg IV given, vss

## 2022-12-14 NOTE — Progress Notes (Signed)
Called to room to assist during endoscopic procedure.  Patient ID and intended procedure confirmed with present staff. Received instructions for my participation in the procedure from the performing physician.  

## 2022-12-14 NOTE — Progress Notes (Signed)
Report given to PACU, vss 

## 2022-12-15 ENCOUNTER — Telehealth: Payer: Self-pay

## 2022-12-15 NOTE — Telephone Encounter (Signed)
  Follow up Call-     12/14/2022    7:27 AM  Call back number  Post procedure Call Back phone  # [phone]  Permission to leave phone message Yes     Patient questions:  Do you have a fever, pain , or abdominal swelling? No. Pain Score  0 *  Have you tolerated food without any problems? Yes.    Have you been able to return to your normal activities? Yes.    Do you have any questions about your discharge instructions: Diet   No. Medications  No. Follow up visit  No.  Do you have questions or concerns about your Care? No.  Actions: * If pain score is 4 or above: No action needed, pain <4.

## 2022-12-21 ENCOUNTER — Encounter: Payer: Self-pay | Admitting: Cardiology

## 2022-12-21 ENCOUNTER — Telehealth: Payer: Self-pay | Admitting: Cardiology

## 2022-12-21 DIAGNOSIS — Z79899 Other long term (current) drug therapy: Secondary | ICD-10-CM

## 2022-12-21 NOTE — Telephone Encounter (Signed)
*  STAT* If patient is at the pharmacy, call can be transferred to refill team.   1. Which medications need to be refilled? (please list name of each medication and dose if known)   ezetimibe (ZETIA) 10 MG tablet   2. Which pharmacy/location (including street and city if local pharmacy) is medication to be sent to?  [id number] - [place], [place] - [address] AT [facility] OF [address]   3. Do they need a 30 day or 90 day supply?   90 day  Patient stated she still has some medication left.

## 2022-12-22 MED ORDER — EZETIMIBE 10 MG PO TABS: 10.0000 mg | ORAL_TABLET | Freq: Every day | ORAL | 3 refills | Status: DC

## 2022-12-22 NOTE — Telephone Encounter (Signed)
Patient is calling back stating that she needs this medication called in today.

## 2022-12-22 NOTE — Telephone Encounter (Signed)
Pt calling back about refill, please advise.

## 2022-12-22 NOTE — Progress Notes (Signed)
HPI: FU hyperlipidemia.  Calcium score April 2023 351 which was 98th percentile.  Carotid Dopplers April 2023 near normal bilaterally.  Since last seen the patient has dyspnea with more extreme activities but not with routine activities. It is relieved with rest. It is not associated with chest pain. There is no orthopnea, PND or pedal edema. There is no syncope or palpitations. There is no exertional chest pain.   Current Outpatient Medications  Medication Sig Dispense Refill   acetaminophen (TYLENOL) 500 MG tablet Take 500 mg by mouth every 6 (six) hours as needed.     albuterol (VENTOLIN HFA) 108 (90 Base) MCG/ACT inhaler Inhale 1-2 puffs into the lungs every 6 (six) hours as needed for wheezing or shortness of breath. 1 each 3   Calcium-Magnesium-Vitamin D (CITRACAL SLOW RELEASE PO) Take by mouth.     cetirizine (ZYRTEC) 10 MG tablet Take 10 mg by mouth daily.     diclofenac (VOLTAREN) 75 MG EC tablet Take 1 tablet (75 mg total) by mouth 2 (two) times daily. 60 tablet 3   diclofenac Sodium (VOLTAREN) 1 % GEL Apply 2 g topically 4 (four) times daily.     Evolocumab (REPATHA SURECLICK) 140 MG/ML SOAJ INJECT 140 MG INTO THE SKIN EVERY 14 DAYS 6 mL 0   ezetimibe (ZETIA) 10 MG tablet Take 1 tablet (10 mg total) by mouth daily. 90 tablet 0   Ferrous Sulfate (SLOW RELEASE IRON PO) Take by mouth once a week.     hydrochlorothiazide (MICROZIDE) 12.5 MG capsule TAKE 1 CAPSULE(12.5 MG) BY MOUTH DAILY 90 capsule 3   Multiple Vitamins-Minerals (ALIVE WOMENS 50+) CHEW Chew by mouth.     nystatin ointment (MYCOSTATIN) Apply 1 Application topically daily as needed.     Magnesium 250 MG TABS Take by mouth.     TURMERIC-GINGER PO Take by mouth.     Current Facility-Administered Medications  Medication Dose Route Frequency Provider Last Rate Last Admin   0.9 %  sodium chloride infusion  500 mL Intravenous Continuous Armbruster, Willaim Rayas, MD         Past Medical History:  Diagnosis Date    Arthritis    Asthma    Cataract    Bilteral removed   Headache    migraines in the past   Hyperlipidemia    Morbid (severe) obesity due to excess calories (HCC)    PTSD (post-traumatic stress disorder)     Past Surgical History:  Procedure Laterality Date   cataractremopval     CHOLECYSTECTOMY     HERNIA REPAIR     umbilical hernia repair   KNEE SURGERY     KNEE SURGERY Left 2002   Right leg surgery  2021   SHOULDER HEMI-ARTHROPLASTY Left 06/18/2016   Procedure: LEFT SHOULDER HEMI-ARTHROPLASTY;  Surgeon: Yolonda Kida, MD;  Location: [facility];  Service: Orthopedics;  Laterality: Left;   TUBAL LIGATION      Social History   Socioeconomic History   Marital status: Single    Spouse name: Not on file   Number of children: 2   Years of education: Not on file   Highest education level: Not on file  Occupational History   Not on file  Tobacco Use   Smoking status: Former    Current packs/day: 0.00    Types: Cigarettes    Quit date: 06/17/2012    Years since quitting: 10.5   Smokeless tobacco: Never  Substance and Sexual Activity   Alcohol use: Yes  Comment: Rare   Drug use: No   Sexual activity: Not on file  Other Topics Concern   Not on file  Social History Narrative   Not on file   Social Determinants of Health   Financial Resource Strain: Medium Risk (08/18/2022)   Overall Financial Resource Strain (CARDIA)    Difficulty of Paying Living Expenses: Somewhat hard  Food Insecurity: No Food Insecurity (08/18/2022)   Hunger Vital Sign    Worried About Running Out of Food in the Last Year: Never true    Ran Out of Food in the Last Year: Never true  Transportation Needs: No Transportation Needs (08/18/2022)   PRAPARE - Administrator, Civil Service (Medical): No    Lack of Transportation (Non-Medical): No  Physical Activity: Inactive (08/18/2022)   Exercise Vital Sign    Days of Exercise per Week: 0 days    Minutes of Exercise per Session: 0 min   Stress: Stress Concern Present (08/18/2022)   [employer] of Occupational Health - Occupational Stress Questionnaire    Feeling of Stress : To some extent  Social Connections: Moderately Isolated (08/18/2022)   Social Connection and Isolation Panel [NHANES]    Frequency of Communication with Friends and Family: More than three times a week    Frequency of Social Gatherings with Friends and Family: Once a week    Attends Religious Services: Never    Database administrator or Organizations: Yes    Attends Engineer, structural: More than 4 times per year    Marital Status: Divorced  Intimate Partner Violence: Not At Risk (08/18/2022)   Humiliation, Afraid, Rape, and Kick questionnaire    Fear of Current or Ex-Partner: No    Emotionally Abused: No    Physically Abused: No    Sexually Abused: No    Family History  Problem Relation Age of Onset   Stomach cancer Mother    Alcohol abuse Mother    Cancer Mother    Alcohol abuse Father    Heart disease Father    COPD Sister    Arthritis Sister    Alcohol abuse Sister    Mental illness Sister    Heart disease Brother    Colon cancer Neg Hx    Colon polyps Neg Hx    Esophageal cancer Neg Hx    Rectal cancer Neg Hx     ROS: no fevers or chills, productive cough, hemoptysis, dysphasia, odynophagia, melena, hematochezia, dysuria, hematuria, rash, seizure activity, orthopnea, PND, pedal edema, claudication. Remaining systems are negative.  Physical Exam: Well-developed obese in no acute distress.  Skin is warm and dry.  HEENT is normal.  Neck is supple.  Chest is clear to auscultation with normal expansion.  Cardiovascular exam is regular rate and rhythm.  Abdominal exam nontender or distended. No masses palpated. Extremities show no edema. neuro grossly intact  EKG Interpretation Date/Time:  Wednesday December 30 2022 11:45:13 EDT Ventricular Rate:  78 PR Interval:  166 QRS Duration:  98 QT Interval:  414 QTC  Calculation: 471 R Axis:   -19  Text Interpretation: Normal sinus rhythm Normal ECG No previous ECGs available Confirmed by Olga Millers ([id number]) on 12/30/2022 11:55:34 AM    A/P  1 coronary calcification-patient denies chest pain.  Continue medical therapy with aspirin and statin.  2 hyperlipidemia-patient is intolerant to statins.  Continue Repatha and Zetia.  Check lipids and liver.  Olga Millers, MD

## 2022-12-23 ENCOUNTER — Other Ambulatory Visit: Payer: Self-pay | Admitting: Family

## 2022-12-23 DIAGNOSIS — Z1231 Encounter for screening mammogram for malignant neoplasm of breast: Secondary | ICD-10-CM

## 2022-12-23 MED ORDER — EZETIMIBE 10 MG PO TABS: 10.0000 mg | ORAL_TABLET | Freq: Every day | ORAL | 0 refills | Status: DC

## 2022-12-23 MED ORDER — EZETIMIBE 10 MG PO TABS: 10.0000 mg | ORAL_TABLET | Freq: Every day | ORAL | 3 refills | Status: DC

## 2022-12-23 NOTE — Addendum Note (Signed)
Addended by: Stevan Born on: 12/23/2022 11:43 AM   Modules accepted: Orders

## 2022-12-28 DIAGNOSIS — H35363 Drusen (degenerative) of macula, bilateral: Secondary | ICD-10-CM | POA: Diagnosis not present

## 2022-12-28 DIAGNOSIS — H353131 Nonexudative age-related macular degeneration, bilateral, early dry stage: Secondary | ICD-10-CM | POA: Diagnosis not present

## 2022-12-28 DIAGNOSIS — H25812 Combined forms of age-related cataract, left eye: Secondary | ICD-10-CM | POA: Diagnosis not present

## 2022-12-28 DIAGNOSIS — H35371 Puckering of macula, right eye: Secondary | ICD-10-CM | POA: Diagnosis not present

## 2022-12-28 DIAGNOSIS — Z961 Presence of intraocular lens: Secondary | ICD-10-CM | POA: Diagnosis not present

## 2022-12-28 DIAGNOSIS — H43811 Vitreous degeneration, right eye: Secondary | ICD-10-CM | POA: Diagnosis not present

## 2022-12-30 ENCOUNTER — Other Ambulatory Visit: Payer: Self-pay | Admitting: Family

## 2022-12-30 ENCOUNTER — Encounter: Payer: Self-pay | Admitting: Cardiology

## 2022-12-30 ENCOUNTER — Ambulatory Visit: Payer: Medicare (Managed Care) | Admitting: Cardiology

## 2022-12-30 ENCOUNTER — Telehealth: Payer: Self-pay | Admitting: Family

## 2022-12-30 VITALS — BP 122/80 | HR 78 | Ht 65.0 in | Wt 283.1 lb

## 2022-12-30 DIAGNOSIS — I2510 Atherosclerotic heart disease of native coronary artery without angina pectoris: Secondary | ICD-10-CM

## 2022-12-30 DIAGNOSIS — R7989 Other specified abnormal findings of blood chemistry: Secondary | ICD-10-CM

## 2022-12-30 DIAGNOSIS — Z79899 Other long term (current) drug therapy: Secondary | ICD-10-CM

## 2022-12-30 NOTE — Telephone Encounter (Signed)
Pt said that she is supposed to come back in for repeat labs for her kidneys but did not see orders placed. Please advise when labs are placed. Pt also wanted to know if the labs are fasting or not.

## 2022-12-30 NOTE — Patient Instructions (Signed)
[employer] on the 3 rd floor in [address] Hours-Monday - Friday 8 am-11:30 am and 1 pm -4 pm    Follow-Up: At [facility], you and your health needs are our priority.  As part of our continuing mission to provide you with exceptional heart care, we have created designated Provider Care Teams.  These Care Teams include your primary Cardiologist (physician) and Advanced Practice Providers (APPs -  Physician Assistants and Nurse Practitioners) who all work together to provide you with the care you need, when you need it.  We recommend signing up for the patient portal called "MyChart".  Sign up information is provided on this After Visit Summary.  MyChart is used to connect with patients for Virtual Visits (Telemedicine).  Patients are able to view lab/test results, encounter notes, upcoming appointments, etc.  Non-urgent messages can be sent to your provider as well.   To learn more about what you can do with MyChart, go to [email or web address].    Your next appointment:   12 month(s)  Provider:   Olga Millers MD

## 2022-12-30 NOTE — Telephone Encounter (Signed)
Ok to send message via Clinical cytogeneticist

## 2023-01-04 ENCOUNTER — Ambulatory Visit: Payer: Medicare (Managed Care) | Admitting: Physical Therapy

## 2023-01-04 ENCOUNTER — Encounter: Payer: Self-pay | Admitting: Physical Therapy

## 2023-01-04 ENCOUNTER — Other Ambulatory Visit: Payer: Self-pay

## 2023-01-04 DIAGNOSIS — N393 Stress incontinence (female) (male): Secondary | ICD-10-CM | POA: Insufficient documentation

## 2023-01-04 DIAGNOSIS — R278 Other lack of coordination: Secondary | ICD-10-CM | POA: Diagnosis not present

## 2023-01-04 DIAGNOSIS — M6281 Muscle weakness (generalized): Secondary | ICD-10-CM | POA: Diagnosis not present

## 2023-01-04 NOTE — Therapy (Addendum)
OUTPATIENT PHYSICAL THERAPY FEMALE PELVIC EVALUATION   Patient Name: Stephanie Hensley MRN: [id number] DOB:Apr 21, 1961, 62 y.o., female Today's Date: 01/04/2023  END OF SESSION:  PT End of Session - 01/04/23 1524     Visit Number 1    Date for PT Re-Evaluation 03/29/23    Authorization Type [employer]    Authorization - Visit Number 1    Authorization - Number of Visits 10    PT Start Time 1524    PT Stop Time 1610    PT Time Calculation (min) 46 min    Activity Tolerance Patient tolerated treatment well    Behavior During Therapy WFL for tasks assessed/performed             Past Medical History:  Diagnosis Date   Arthritis    Asthma    Cataract    Bilteral removed   Headache    migraines in the past   Hyperlipidemia    Morbid (severe) obesity due to excess calories (HCC)    PTSD (post-traumatic stress disorder)    Past Surgical History:  Procedure Laterality Date   cataractremopval     CHOLECYSTECTOMY     HERNIA REPAIR     umbilical hernia repair   KNEE SURGERY     KNEE SURGERY Left 2002   Right leg surgery  2021   SHOULDER HEMI-ARTHROPLASTY Left 06/18/2016   Procedure: LEFT SHOULDER HEMI-ARTHROPLASTY;  Surgeon: Yolonda Kida, MD;  Location: [facility] OR;  Service: Orthopedics;  Laterality: Left;   TUBAL LIGATION     Patient Active Problem List   Diagnosis Date Noted   Macular degeneration 04/16/2022   ASCUS with positive high risk HPV cervical 09/04/2021   Cutaneous candidiasis 05/10/2020   Asthma 04/19/2020   Depression 04/19/2020   Nuclear sclerotic cataract of right eye 04/19/2020   Epiretinal membrane (ERM) of right eye 04/19/2020   Localized traumatic cataract of right eye 04/19/2020   Post traumatic stress disorder 04/19/2020   Morbid obesity (HCC) 09/01/2019   Tibia/fibula fracture, right, closed, with nonunion, subsequent encounter 07/06/2019   Closed 4-part fracture of proximal humerus, left, initial encounter 06/18/2016    PCP: Olive Bass, FNP  REFERRING PROVIDER: Willodean Rosenthal, MD   REFERRING DIAG: N39.3 (ICD-10-CM) - Stress incontinence, female   THERAPY DIAG:  Muscle weakness (generalized)  Other lack of coordination  Rationale for Evaluation and Treatment: Rehabilitation  ONSET DATE: 2018  SUBJECTIVE:  SUBJECTIVE STATEMENT: Shoulder had a shoulder injury and not able to get out of bed and leaked urine.  Fluid intake: Yes: a lot of water, green tea, small diet coke, maybe a cup of coffee 3 times per week    PAIN:  Are you having pain? No  PRECAUTIONS: None  RED FLAGS: None   WEIGHT BEARING RESTRICTIONS: No  FALLS:  Has patient fallen in last 6 months? No  LIVING ENVIRONMENT: Lives with: lives alone  OCCUPATION: part time sitting at front desk; walks her dog 5 times per day, has a [employer] to [employer]  PLOF: Independent  PATIENT GOALS: stop wearing pads  PERTINENT HISTORY:  Cholecystectomy; Hernia repair; Shoulder Hemi-arthroplasty left 06/18/26; PTSD Sexual abuse: No  BOWEL MOVEMENT: no issues with bowel movements Pain with bowel movement: No   URINATION: Pain with urination: No Fully empty bladder: Yes: sits there to make sure her bladder empties Stream: Strong Urgency: Yes: when watching TV and has the urge will wait a little then leak while walking to the bathroom Frequency: every 2-3 hours, depends on how much she drinks, night 1-2 times Leakage: Urge to void, Walking to the bathroom, Coughing, and shifting body on the couch, bed and car Pads: Yes: poise pad at night, liners during the day 2-3 times  INTERCOURSE: not sexually active  PREGNANCY: Vaginal deliveries 3 Tearing No   PROLAPSE: None   OBJECTIVE:   DIAGNOSTIC FINDINGS:  Colonoscopy had a polyp that was  precancerous   COGNITION: Overall cognitive status: Within functional limits for tasks assessed     SENSATION: Light touch: Appears intact Proprioception: Appears intact   POSTURE: rounded shoulders, forward head, decreased lumbar lordosis, flexed trunk , and weight shift right  PELVIC ALIGNMENT:  LUMBARAROM/PROM:  A/PROM A/PROM  eval  Flexion full  Extension Decreased by 25%  Right lateral flexion Decreased by 25%  Left lateral flexion Decreased by 25%  Right rotation Decreased by 25%  Left rotation Decreased by 25%   (Blank rows = not tested)  LOWER EXTREMITY ROM:  Passive ROM Right eval Left eval  Hip internal rotation 0 0   (Blank rows = not tested)  LOWER EXTREMITY MMT:  MMT Right eval Left eval  Hip extension 3/5 3/5  Hip abduction 3/5 3/5   PALPATION:   General  she will bulge her abdomen when contracting it.                 External Perineal Exam n/a                             Internal Pelvic Floor n/a  Patient confirms identification and approves PT to assess internal pelvic floor and treatment No, strength my be assess in the future  PELVIC MMT:   MMT eval  Vaginal   Internal Anal Sphincter   External Anal Sphincter   Puborectalis   Diastasis Recti none  (Blank rows = not tested)        RUSI: Using the RUSI to work on pelvic floor contraction with  the pelvic floor 2 setting and transducer suprapubically. The image was slightly difficult to see for measurements due to the adipose tissue but able to see the area move upward for patient to understand how to contract the pelvic floor.  Using the RUSI below the umbilicus on Abdominal 2 setting to see the abdominal contraction and educate patient to not bulge her abdomen instead of contract with  pressing down on the mat.   TODAY'S TREATMENT:                                                                                                                              DATE: 01/04/23  EVAL see  below   PATIENT EDUCATION:  01/04/23 Education details: Access Code: [email or web address] Person educated: Patient Education method: Programmer, multimedia, Facilities manager, Actor cues, Verbal cues, and Handouts Education comprehension: verbalized understanding, returned demonstration, verbal cues required, tactile cues required, and needs further education  HOME EXERCISE PROGRAM: 01/04/23 Access Code: [email or web address] URL: [email or web address] Date: 01/04/2023 Prepared by: Eulis Foster  Exercises - Supine Pelvic Floor Contraction  - 3 x daily - 7 x weekly - 1 sets - 10 reps - Supine Transversus Abdominis Bracing - Hands on Ground  - 3 x daily - 7 x weekly - 1 sets - 5 reps - 5 sec hold  ASSESSMENT:  CLINICAL IMPRESSION: Patient is a 62 y.o. female who was seen today for physical therapy evaluation and treatment for stress incontinence. Patient reports her urinary leakage started suddenly 8 years ago. She will leak urine with urge to void, walking to the bathroom, shifting to in the car, on the couch, and coughing Called patient. Patient uses 2 poise pads at night and 2-3 liners during the day. Patient will bulge her abdomen when she contracts her abdominals. She was not comfortable with internal assessment of strength. We used the RUSI but unable to get measurements due to her adipose tissue. She was able to see on the RUSI the movement to understand how to contract her pelvic floor without contacting her gluteals. She was able to see how to contract her abdominals without bulging them out. Patient will benefit from skilled therapy to improve pelvic floor strength and core to reduce her leakage.       OBJECTIVE IMPAIRMENTS: decreased activity tolerance, decreased coordination, decreased endurance, decreased ROM, decreased strength, and increased fascial restrictions.   ACTIVITY LIMITATIONS: transfers, continence, and locomotion level  PARTICIPATION LIMITATIONS: meal prep, cleaning, and community  activity  PERSONAL FACTORS: Age, Fitness, Time since onset of injury/illness/exacerbation, and 3+ comorbidities: Cholecystectomy; Hernia repair; Shoulder Hemi-arthroplasty left 06/18/26; PTSD  are also affecting patient's functional outcome.   REHAB POTENTIAL: Good  CLINICAL DECISION MAKING: Evolving/moderate complexity  EVALUATION COMPLEXITY: Moderate   GOALS: Goals reviewed with patient? Yes  SHORT TERM GOALS: Target date: 01/31/23  Patient able to contract her abdominals without bulging them.  Baseline: Goal status: INITIAL  2.  Patient understands how to manage her intraabdominal pressure to not put pressure on her pelvic floor  Baseline:  Goal status: INITIAL  3.  Patient is educated on behavioral techniques to reduce the urge to void.  Baseline:  Goal status: INITIAL   LONG TERM GOALS: Target date: 03/29/23  Patient is independent with advanced core and hip exercise with correct technique and bulging her abdomen.  Baseline:  Goal status: INITIAL  2.  Patient  is able to exercise at the gym with correct body mechanics to prevent pressure on her pelvic floor.  Baseline:  Goal status: INITIAL  3.  Patient is able to use the urge to void behavioral technique to reduce the urinary leakage as she walks to the bathroom >/= 75%.  Baseline:  Goal status: INITIAL  4.  Patient is able to shift in her car seat without urinary leakage due to reduction of abdominal bulging and pelvic floor contraction.  Baseline:  Goal status: INITIAL  5.   Patient is able to move on the couch and stand up from the couch with minimal to no urinary leakage. Baseline:  Goal status: INITIAL  6.  Patient is able to cough with minimal to no urinary leakage.  Baseline:  Goal status: INITIAL  PLAN:  PT FREQUENCY: 1x/week  PT DURATION: 12 weeks  PLANNED INTERVENTIONS: Therapeutic exercises, Therapeutic activity, Neuromuscular re-education, Patient/Family education, Joint mobilization, Dry  Needling, Electrical stimulation, Spinal mobilization, Cryotherapy, Moist heat, Ultrasound, Biofeedback, and Manual therapy  PLAN FOR NEXT SESSION: work on pelvic floor contraction with abdominals with low level, transitional movements in bed with correct breath, getting up and down from chair without pressure on the pelvic floor   Eulis Foster, PT 01/04/23 5:16 PM  PHYSICAL THERAPY DISCHARGE SUMMARY  Visits from Start of Care: 1  Current functional level related to goals / functional outcomes: Patient has cancelled all of her appointments due to financial reasons and wants to be discharged.    Remaining deficits: See above.    Education / Equipment: HEP   Patient agrees to discharge. Patient goals were not met. Patient is being discharged due to financial reasons. Thank you for the referral. Eulis Foster, PT 01/19/23 9:18 AM

## 2023-01-11 ENCOUNTER — Other Ambulatory Visit: Payer: Self-pay | Admitting: Pharmacist

## 2023-01-11 DIAGNOSIS — I2510 Atherosclerotic heart disease of native coronary artery without angina pectoris: Secondary | ICD-10-CM

## 2023-01-11 DIAGNOSIS — E7800 Pure hypercholesterolemia, unspecified: Secondary | ICD-10-CM

## 2023-01-11 MED ORDER — REPATHA SURECLICK 140 MG/ML ~~LOC~~ SOAJ: 140.0000 mg | SUBCUTANEOUS | 3 refills | Status: DC

## 2023-01-13 ENCOUNTER — Other Ambulatory Visit: Payer: Self-pay | Admitting: Family

## 2023-01-23 ENCOUNTER — Other Ambulatory Visit: Payer: Self-pay | Admitting: Family

## 2023-02-04 ENCOUNTER — Other Ambulatory Visit (INDEPENDENT_AMBULATORY_CARE_PROVIDER_SITE_OTHER): Payer: Medicare (Managed Care)

## 2023-02-04 DIAGNOSIS — R7989 Other specified abnormal findings of blood chemistry: Secondary | ICD-10-CM

## 2023-02-04 DIAGNOSIS — I2510 Atherosclerotic heart disease of native coronary artery without angina pectoris: Secondary | ICD-10-CM | POA: Diagnosis not present

## 2023-02-04 LAB — COMPREHENSIVE METABOLIC PANEL WITH GFR
ALT: 9 U/L (ref 0–35)
AST: 21 U/L (ref 0–37)
Albumin: 4.1 g/dL (ref 3.5–5.2)
Alkaline Phosphatase: 95 U/L (ref 39–117)
BUN: 25 mg/dL — ABNORMAL HIGH (ref 6–23)
CO2: 25 meq/L (ref 19–32)
Calcium: 9.6 mg/dL (ref 8.4–10.5)
Chloride: 104 meq/L (ref 96–112)
Creatinine, Ser: 1.15 mg/dL (ref 0.40–1.20)
GFR: 51.96 mL/min — ABNORMAL LOW
Glucose, Bld: 99 mg/dL (ref 70–99)
Potassium: 4.6 meq/L (ref 3.5–5.1)
Sodium: 138 meq/L (ref 135–145)
Total Bilirubin: 0.4 mg/dL (ref 0.2–1.2)
Total Protein: 7.2 g/dL (ref 6.0–8.3)

## 2023-02-05 ENCOUNTER — Encounter: Payer: Self-pay | Admitting: Cardiology

## 2023-02-05 LAB — HEPATIC FUNCTION PANEL
Bilirubin Total: 0.3 mg/dL (ref 0.0–1.2)
Total Protein: 7.1 g/dL (ref 6.0–8.5)

## 2023-02-14 ENCOUNTER — Other Ambulatory Visit: Payer: Self-pay | Admitting: Family

## 2023-02-22 ENCOUNTER — Ambulatory Visit: Payer: Medicare (Managed Care) | Admitting: Physical Therapy

## 2023-02-23 DIAGNOSIS — Z23 Encounter for immunization: Secondary | ICD-10-CM

## 2023-02-23 NOTE — Addendum Note (Signed)
Addended by: Margorie John on: 02/23/2023 01:19 PM   Modules accepted: Orders

## 2023-03-01 ENCOUNTER — Encounter: Payer: Medicare (Managed Care) | Admitting: Physical Therapy

## 2023-03-08 ENCOUNTER — Encounter: Payer: Medicare (Managed Care) | Admitting: Physical Therapy

## 2023-03-22 ENCOUNTER — Encounter: Payer: Self-pay | Admitting: Family

## 2023-03-22 ENCOUNTER — Other Ambulatory Visit: Payer: Self-pay | Admitting: Family

## 2023-03-22 ENCOUNTER — Encounter: Payer: Medicare (Managed Care) | Admitting: Physical Therapy

## 2023-03-22 MED ORDER — DICLOFENAC SODIUM 1 % EX GEL: 2.0000 g | Freq: Four times a day (QID) | CUTANEOUS | 1 refills | Status: DC

## 2023-03-29 ENCOUNTER — Encounter: Payer: Medicare (Managed Care) | Admitting: Physical Therapy

## 2023-03-31 ENCOUNTER — Telehealth: Payer: Self-pay | Admitting: Family

## 2023-03-31 NOTE — Telephone Encounter (Signed)
Form has been placed in PCP folder.

## 2023-03-31 NOTE — Telephone Encounter (Signed)
Pt dropped off paperwork to be completed by PCP.Marland Kitchen Please call pt when form is ready to be picked up.

## 2023-04-02 NOTE — Telephone Encounter (Signed)
Form has been completed and placed up front for pt pick up, pt is aware and stated she will be by to pick it up today.

## 2023-04-05 ENCOUNTER — Encounter: Payer: Medicare (Managed Care) | Admitting: Physical Therapy

## 2023-04-29 ENCOUNTER — Encounter: Payer: Self-pay | Admitting: Pharmacist

## 2023-05-17 ENCOUNTER — Ambulatory Visit
Admission: RE | Admit: 2023-05-17 | Discharge: 2023-05-17 | Disposition: A | Discharge status: Home / Self Care | Payer: Medicare (Managed Care) | Source: Ambulatory Visit | Attending: Family | Admitting: Family

## 2023-05-17 DIAGNOSIS — Z1231 Encounter for screening mammogram for malignant neoplasm of breast: Secondary | ICD-10-CM

## 2023-05-30 ENCOUNTER — Other Ambulatory Visit: Payer: Self-pay | Admitting: Cardiology

## 2023-05-30 DIAGNOSIS — E7800 Pure hypercholesterolemia, unspecified: Secondary | ICD-10-CM

## 2023-05-30 DIAGNOSIS — I2510 Atherosclerotic heart disease of native coronary artery without angina pectoris: Secondary | ICD-10-CM

## 2023-06-10 DIAGNOSIS — J069 Acute upper respiratory infection, unspecified: Secondary | ICD-10-CM | POA: Diagnosis not present

## 2023-06-10 DIAGNOSIS — Z20822 Contact with and (suspected) exposure to covid-19: Secondary | ICD-10-CM | POA: Diagnosis not present

## 2023-06-10 DIAGNOSIS — R519 Headache, unspecified: Secondary | ICD-10-CM | POA: Diagnosis not present

## 2023-06-21 DIAGNOSIS — H2512 Age-related nuclear cataract, left eye: Secondary | ICD-10-CM | POA: Diagnosis not present

## 2023-06-21 DIAGNOSIS — H35371 Puckering of macula, right eye: Secondary | ICD-10-CM | POA: Diagnosis not present

## 2023-06-21 DIAGNOSIS — H35363 Drusen (degenerative) of macula, bilateral: Secondary | ICD-10-CM | POA: Diagnosis not present

## 2023-06-21 DIAGNOSIS — H353131 Nonexudative age-related macular degeneration, bilateral, early dry stage: Secondary | ICD-10-CM | POA: Diagnosis not present

## 2023-07-12 ENCOUNTER — Telehealth: Payer: Self-pay | Admitting: Cardiology

## 2023-07-12 NOTE — Telephone Encounter (Signed)
Pt c/o medication issue:  1. Name of Medication:   REPATHA SURECLICK 140 MG/ML SOAJ    2. How are you currently taking this medication (dosage and times per day)?   3. Are you having a reaction (difficulty breathing--STAT)? No   4. What is your medication issue? Prior Berkley Harvey is needed. Please advise

## 2023-07-12 NOTE — Telephone Encounter (Signed)
Message routed to PA Rx team for prior auth

## 2023-07-13 ENCOUNTER — Telehealth: Payer: Self-pay | Admitting: Pharmacy Technician

## 2023-07-13 ENCOUNTER — Other Ambulatory Visit (HOSPITAL_COMMUNITY): Payer: Self-pay

## 2023-07-13 NOTE — Telephone Encounter (Signed)
Pharmacy Patient Advocate Encounter   Received notification from Pt Calls Messages that prior authorization for Repatha SureClick 140MG /ML auto-injectors is required/requested.   Insurance verification completed.   The patient is insured through [facility] .   Per test claim: PA required; PA submitted to above mentioned insurance via CoverMyMeds Key/confirmation #/EOC [id number] Status is pending

## 2023-07-13 NOTE — Telephone Encounter (Signed)
[employer] called about the prior auth of the Repatha Sureclick 140 MG/ML SOAJ

## 2023-07-13 NOTE — Telephone Encounter (Signed)
Got needed more info in fax so had to submit through blue e.

## 2023-07-14 ENCOUNTER — Other Ambulatory Visit (HOSPITAL_COMMUNITY): Payer: Self-pay

## 2023-07-14 NOTE — Telephone Encounter (Signed)
     Per test claim: just filled 07/02/23 and too soon until 07/23/23

## 2023-07-19 DIAGNOSIS — H52203 Unspecified astigmatism, bilateral: Secondary | ICD-10-CM | POA: Diagnosis not present

## 2023-07-19 DIAGNOSIS — H35371 Puckering of macula, right eye: Secondary | ICD-10-CM | POA: Diagnosis not present

## 2023-07-19 DIAGNOSIS — H2512 Age-related nuclear cataract, left eye: Secondary | ICD-10-CM | POA: Diagnosis not present

## 2023-07-19 DIAGNOSIS — Z961 Presence of intraocular lens: Secondary | ICD-10-CM | POA: Diagnosis not present

## 2023-08-10 DIAGNOSIS — K080 Exfoliation of teeth due to systemic causes: Secondary | ICD-10-CM | POA: Diagnosis not present

## 2023-08-17 ENCOUNTER — Ambulatory Visit (INDEPENDENT_AMBULATORY_CARE_PROVIDER_SITE_OTHER): Payer: Medicare (Managed Care) | Admitting: Family

## 2023-08-17 ENCOUNTER — Encounter: Payer: Self-pay | Admitting: Family

## 2023-08-17 VITALS — BP 114/72 | HR 84 | Ht 65.0 in | Wt 280.0 lb

## 2023-08-17 DIAGNOSIS — R7309 Other abnormal glucose: Secondary | ICD-10-CM

## 2023-08-17 DIAGNOSIS — E785 Hyperlipidemia, unspecified: Secondary | ICD-10-CM | POA: Diagnosis not present

## 2023-08-17 DIAGNOSIS — Z0000 Encounter for general adult medical examination without abnormal findings: Secondary | ICD-10-CM | POA: Diagnosis not present

## 2023-08-17 NOTE — Progress Notes (Signed)
 Stephanie Hensley is a 63 y.o. female with the following history as recorded in EpicCare:  Patient Active Problem List   Diagnosis Date Noted   Macular degeneration 04/16/2022   ASCUS with positive high risk HPV cervical 09/04/2021   Cutaneous candidiasis 05/10/2020   Asthma 04/19/2020   Depression 04/19/2020   Nuclear sclerotic cataract of right eye 04/19/2020   Epiretinal membrane (ERM) of right eye 04/19/2020   Localized traumatic cataract of right eye 04/19/2020   Post traumatic stress disorder 04/19/2020   Morbid obesity (HCC) 09/01/2019   Tibia/fibula fracture, right, closed, with nonunion, subsequent encounter 07/06/2019   Closed 4-part fracture of proximal humerus, left, initial encounter 06/18/2016    Current Outpatient Medications  Medication Sig Dispense Refill   acetaminophen (TYLENOL) 500 MG tablet Take 500 mg by mouth every 6 (six) hours as needed.     albuterol (VENTOLIN HFA) 108 (90 Base) MCG/ACT inhaler INHALE 1 TO 2 PUFFS INTO THE LUNGS EVERY 6 HOURS AS NEEDED FOR WHEEZING OR SHORTNESS OF BREATH 6.7 g 1   Calcium-Magnesium-Vitamin D (CITRACAL SLOW RELEASE PO) Take by mouth.     cetirizine (ZYRTEC) 10 MG tablet Take 10 mg by mouth daily.     diclofenac Sodium (VOLTAREN) 1 % GEL Apply 2 g topically 4 (four) times daily. 200 g 1   ezetimibe (ZETIA) 10 MG tablet Take 1 tablet (10 mg total) by mouth daily. 90 tablet 0   Ferrous Sulfate (SLOW RELEASE IRON PO) Take by mouth once a week.     hydrochlorothiazide (MICROZIDE) 12.5 MG capsule TAKE 1 CAPSULE(12.5 MG) BY MOUTH DAILY 90 capsule 3   Magnesium 250 MG TABS Take by mouth.     Multiple Vitamins-Minerals (ALIVE WOMENS 50+) CHEW Chew by mouth.     nystatin ointment (MYCOSTATIN) Apply 1 Application topically daily as needed.     REPATHA SURECLICK 140 MG/ML SOAJ ADMINISTER 1 ML UNDER THE SKIN EVERY 14 DAYS 6 mL 3   No current facility-administered medications for this visit.    Allergies: Crestor [rosuvastatin] and Lipitor  [atorvastatin]  Past Medical History:  Diagnosis Date   Arthritis    Asthma    Cataract    Bilteral removed   Headache    migraines in the past   Hyperlipidemia    Morbid (severe) obesity due to excess calories (HCC)    PTSD (post-traumatic stress disorder)     Past Surgical History:  Procedure Laterality Date   cataractremopval     CHOLECYSTECTOMY     HERNIA REPAIR     umbilical hernia repair   KNEE SURGERY     KNEE SURGERY Left 2002   Right leg surgery  2021   SHOULDER HEMI-ARTHROPLASTY Left 06/18/2016   Procedure: LEFT SHOULDER HEMI-ARTHROPLASTY;  Surgeon: Yolonda Kida, MD;  Location: [facility] OR;  Service: Orthopedics;  Laterality: Left;   TUBAL LIGATION      Family History  Problem Relation Age of Onset   Stomach cancer Mother    Alcohol abuse Mother    Cancer Mother    Alcohol abuse Father    Heart disease Father    COPD Sister    Arthritis Sister    Alcohol abuse Sister    Mental illness Sister    Heart disease Brother    Colon cancer Neg Hx    Colon polyps Neg Hx    Esophageal cancer Neg Hx    Rectal cancer Neg Hx     Social History   Tobacco Use  Smoking status: Former    Current packs/day: 0.00    Types: Cigarettes    Quit date: 06/17/2012    Years since quitting: 11.1   Smokeless tobacco: Never  Substance Use Topics   Alcohol use: Yes    Comment: Rare    Subjective:   Presents for yearly CPE; is interesting in starting GLP-1 if covered by her insurance; has lost 9 pounds since last year's CPE; continuing to work with cardiology for management of Repatha;   Review of Systems  Constitutional: Negative.   HENT: Negative.    Eyes: Negative.   Respiratory: Negative.    Cardiovascular: Negative.   Gastrointestinal: Negative.   Genitourinary: Negative.   Musculoskeletal: Negative.   Skin: Negative.   Neurological: Negative.   Endo/Heme/Allergies: Negative.   Psychiatric/Behavioral: Negative.        Objective:  Vitals:   08/17/23  1431  BP: 114/72  Pulse: 84  SpO2: 99%  Weight: 280 lb (127 kg)  Height: 5\' 5"  (1.651 m)    General: Well developed, well nourished, in no acute distress  Skin : Warm and dry.  Head: Normocephalic and atraumatic  Eyes: Sclera and conjunctiva clear; pupils round and reactive to light; extraocular movements intact  Ears: External normal; canals clear; tympanic membranes normal  Oropharynx: Pink, supple. No suspicious lesions  Neck: Supple without thyromegaly, adenopathy  Lungs: Respirations unlabored; clear to auscultation bilaterally without wheeze, rales, rhonchi  CVS exam: normal rate and regular rhythm.  Abdomen: Soft; nontender; nondistended; normoactive bowel sounds; no masses or hepatosplenomegaly  Musculoskeletal: No deformities; no active joint inflammation  Extremities: No edema, cyanosis, clubbing  Vessels: Symmetric bilaterally  Neurologic: Alert and oriented; speech intact; face symmetrical; moves all extremities well; CNII-XII intact without focal deficit  Assessment:  1. PE (physical exam), annual   2. Hyperlipidemia, unspecified hyperlipidemia type   3. Elevated glucose     Plan:  Age appropriate preventive healthcare needs addressed; encouraged regular eye doctor and dental exams; encouraged regular exercise as tolerated/ weight loss goals; will update labs as needed today; follow-up to be determined;  Explained to patient that Medicare does not cover GLP-1 for weight loss purposes and understandably the patient was upset and frustrated; she notes that she is considering reaching out to the provider who is compounding Wegovy for her sister- expressed understanding;    No follow-ups on file.  Orders Placed This Encounter  Procedures   CBC with Differential/Platelet   Comp Met (CMET)   Lipid panel   Hemoglobin A1c    Requested Prescriptions    No prescriptions requested or ordered in this encounter

## 2023-08-17 NOTE — Patient Instructions (Signed)
 Please let us know if you need help getting established with a new GYN;

## 2023-08-18 ENCOUNTER — Encounter: Payer: Self-pay | Admitting: Family

## 2023-08-18 LAB — CBC WITH DIFFERENTIAL/PLATELET
Basophils Absolute: 0.1 10*3/uL (ref 0.0–0.1)
Basophils Relative: 0.9 % (ref 0.0–3.0)
Eosinophils Absolute: 0.3 10*3/uL (ref 0.0–0.7)
Eosinophils Relative: 2.7 % (ref 0.0–5.0)
HCT: 39.9 % (ref 36.0–46.0)
Hemoglobin: 13.1 g/dL (ref 12.0–15.0)
Lymphocytes Relative: 30.3 % (ref 12.0–46.0)
Lymphs Abs: 3.3 10*3/uL (ref 0.7–4.0)
MCHC: 32.9 g/dL (ref 30.0–36.0)
MCV: 90 fl (ref 78.0–100.0)
Monocytes Absolute: 0.5 10*3/uL (ref 0.1–1.0)
Monocytes Relative: 4.8 % (ref 3.0–12.0)
Neutro Abs: 6.7 10*3/uL (ref 1.4–7.7)
Neutrophils Relative %: 61.3 % (ref 43.0–77.0)
Platelets: 268 10*3/uL (ref 150.0–400.0)
RBC: 4.44 Mil/uL (ref 3.87–5.11)
RDW: 14.4 % (ref 11.5–15.5)
WBC: 11 10*3/uL — ABNORMAL HIGH (ref 4.0–10.5)

## 2023-08-18 LAB — LIPID PANEL
Cholesterol: 123 mg/dL (ref 0–200)
HDL: 57.9 mg/dL (ref >39.00)
LDL Cholesterol: 35 mg/dL (ref 0–99)
NonHDL: 64.94
Total CHOL/HDL Ratio: 2
Triglycerides: 149 mg/dL (ref 0.0–149.0)
VLDL: 29.8 mg/dL (ref 0.0–40.0)

## 2023-08-18 LAB — COMPREHENSIVE METABOLIC PANEL
ALT: 10 U/L (ref 0–35)
AST: 26 U/L (ref 0–37)
Albumin: 4.4 g/dL (ref 3.5–5.2)
Alkaline Phosphatase: 100 U/L (ref 39–117)
BUN: 22 mg/dL (ref 6–23)
CO2: 24 meq/L (ref 19–32)
Calcium: 10.1 mg/dL (ref 8.4–10.5)
Chloride: 104 meq/L (ref 96–112)
Creatinine, Ser: 1.03 mg/dL (ref 0.40–1.20)
GFR: 59.08 mL/min — ABNORMAL LOW (ref >60.00)
Glucose, Bld: 92 mg/dL (ref 70–99)
Potassium: 4.2 meq/L (ref 3.5–5.1)
Sodium: 138 meq/L (ref 135–145)
Total Bilirubin: 0.4 mg/dL (ref 0.2–1.2)
Total Protein: 7.5 g/dL (ref 6.0–8.3)

## 2023-08-18 LAB — HEMOGLOBIN A1C: Hgb A1c MFr Bld: 6.1 % (ref 4.6–6.5)

## 2023-08-20 ENCOUNTER — Ambulatory Visit: Payer: Medicare (Managed Care)

## 2023-08-20 ENCOUNTER — Telehealth: Payer: Self-pay | Admitting: Gastroenterology

## 2023-08-20 NOTE — Telephone Encounter (Signed)
 Pt states she has been dx with diverticulitis in the past. States she has been having issues with this off and on the past several months. Pt scheduled to see Boone Master PA 08/30/23 at 1:30pm. Pt aware of appt.

## 2023-08-20 NOTE — Telephone Encounter (Signed)
 Inbound call from patient stating she has been having diverticulitis flare ups. Stated she was advised that her white blood cell count is high. Patient is currently scheduled for 4/28. Requesting a call back to discuss options to discuss further. Please advise, thank you.

## 2023-08-23 ENCOUNTER — Encounter: Payer: Self-pay | Admitting: Family

## 2023-08-23 DIAGNOSIS — L03115 Cellulitis of right lower limb: Secondary | ICD-10-CM | POA: Diagnosis not present

## 2023-08-24 DIAGNOSIS — K080 Exfoliation of teeth due to systemic causes: Secondary | ICD-10-CM | POA: Diagnosis not present

## 2023-08-30 ENCOUNTER — Encounter: Payer: Self-pay | Admitting: Family

## 2023-08-30 ENCOUNTER — Other Ambulatory Visit: Payer: Self-pay | Admitting: Family

## 2023-08-30 ENCOUNTER — Encounter: Payer: Self-pay | Admitting: Gastroenterology

## 2023-08-30 ENCOUNTER — Ambulatory Visit: Admitting: Gastroenterology

## 2023-08-30 VITALS — BP 130/98 | HR 91 | Ht 65.0 in | Wt 285.0 lb

## 2023-08-30 DIAGNOSIS — R1030 Lower abdominal pain, unspecified: Secondary | ICD-10-CM | POA: Diagnosis not present

## 2023-08-30 DIAGNOSIS — R197 Diarrhea, unspecified: Secondary | ICD-10-CM | POA: Diagnosis not present

## 2023-08-30 DIAGNOSIS — R195 Other fecal abnormalities: Secondary | ICD-10-CM

## 2023-08-30 DIAGNOSIS — K654 Sclerosing mesenteritis: Secondary | ICD-10-CM

## 2023-08-30 DIAGNOSIS — W57XXXS Bitten or stung by nonvenomous insect and other nonvenomous arthropods, sequela: Secondary | ICD-10-CM

## 2023-08-30 MED ORDER — DICYCLOMINE HCL 10 MG PO CAPS: 10.0000 mg | ORAL_CAPSULE | Freq: Three times a day (TID) | ORAL | 0 refills | Status: DC

## 2023-08-30 NOTE — Progress Notes (Signed)
 Chief Complaint: Lower abdominal pain and diarrhea Primary GI MD: Dr. [name]  HPI: 63 year old female with medical history as listed below presents for evaluation of lower abdominal pain and diarrhea.  History of CT abdomen pelvis with contrast 11/2018 for abdominal pain showed diverticulosis without diverticulitis.  Fibrosing mesenteritis and small bowel.  Otherwise unrevealing.  Recent colonoscopy done for positive Cologuard July 2024 showed diverticulosis and one 10 mm polyp that was a tubular adenoma with high-grade dysplasia and a recall of 3 years  Today patient presents with intermittent lower abdominal pain and diarrhea.  She states every 2 to 3 weeks she will have an episode of lower abdominal cramping and associated diarrhea.  This typically last a day or 2 without identifiable trigger.  She does not associate these episodes with increased stress or certain type of food she eats.  These episodes are very strenuous for her and caused her a lot of stress.  She will also have nausea during these episodes  In between these episodes she has normal bowel movements typically 2 to 3/day that are soft and formed without straining.  Denies melena/hematochezia.  Denies weight loss.  Recent labs with PCP 08/17/2023 show very mild leukocytosis of 11.0.  Normal CMP.  PREVIOUS GI WORKUP   Colonoscopy 12/2022 - Medium- sized lipoma in the sigmoid colon and in the cecum.  - Diverticulosis in the left colon and in the right colon.  - One 10 mm polyp in the sigmoid colon, removed with a hot snare. Resected and retrieved.  - Internal hemorrhoids.  - The examination was otherwise normal. - repeat 3 years  Diagnosis Surgical [P], colon, sigmoid, polyp (1) - TUBULAR ADENOMA WITH HIGH GRADE DYSPLASIA (SEE NOTE) - NO MALIGNANCY IDENTIFIED  Past Medical History:  Diagnosis Date   Arthritis    Asthma    Cataract    Bilteral removed   Headache    migraines in the past   Hyperlipidemia     Morbid (severe) obesity due to excess calories (HCC)    PTSD (post-traumatic stress disorder)     Past Surgical History:  Procedure Laterality Date   cataractremopval     CHOLECYSTECTOMY     HERNIA REPAIR     umbilical hernia repair   KNEE SURGERY     KNEE SURGERY Left 2002   Right leg surgery  2021   SHOULDER HEMI-ARTHROPLASTY Left 06/18/2016   Procedure: LEFT SHOULDER HEMI-ARTHROPLASTY;  Surgeon: Yolonda Kida, MD;  Location: [facility];  Service: Orthopedics;  Laterality: Left;   TUBAL LIGATION      Current Outpatient Medications  Medication Sig Dispense Refill   acetaminophen (TYLENOL) 500 MG tablet Take 500 mg by mouth every 6 (six) hours as needed.     albuterol (VENTOLIN HFA) 108 (90 Base) MCG/ACT inhaler INHALE 1 TO 2 PUFFS INTO THE LUNGS EVERY 6 HOURS AS NEEDED FOR WHEEZING OR SHORTNESS OF BREATH 6.7 g 1   Calcium-Magnesium-Vitamin D (CITRACAL SLOW RELEASE PO) Take by mouth.     cetirizine (ZYRTEC) 10 MG tablet Take 10 mg by mouth daily.     diclofenac Sodium (VOLTAREN) 1 % GEL Apply 2 g topically 4 (four) times daily. (Patient taking differently: Apply 2 g topically as needed.) 200 g 1   ezetimibe (ZETIA) 10 MG tablet Take 1 tablet (10 mg total) by mouth daily. 90 tablet 0   Ferrous Sulfate (SLOW RELEASE IRON PO) Take by mouth. Twice a week     hydrochlorothiazide (MICROZIDE) 12.5 MG capsule  TAKE 1 CAPSULE(12.5 MG) BY MOUTH DAILY 90 capsule 3   Magnesium 250 MG TABS Take by mouth.     Multiple Vitamins-Minerals (ALIVE WOMENS 50+) CHEW Chew by mouth.     nystatin ointment (MYCOSTATIN) Apply 1 Application topically daily as needed.     REPATHA SURECLICK 140 MG/ML SOAJ ADMINISTER 1 ML UNDER THE SKIN EVERY 14 DAYS 6 mL 3   No current facility-administered medications for this visit.    Allergies as of 08/30/2023 - Review Complete 08/30/2023  Allergen Reaction Noted   Crestor [rosuvastatin]  10/30/2021   Lipitor [atorvastatin]  10/30/2021    Family History  Problem  Relation Age of Onset   Stomach cancer Mother    Alcohol abuse Mother    Alcohol abuse Father    Heart disease Father    COPD Sister    Arthritis Sister    Alcohol abuse Sister    Mental illness Sister    Heart disease Brother    Colon cancer Neg Hx    Colon polyps Neg Hx    Esophageal cancer Neg Hx    Rectal cancer Neg Hx     Social History   Socioeconomic History   Marital status: Single    Spouse name: Not on file   Number of children: 2   Years of education: Not on file   Highest education level: Associate degree: occupational, Scientist, product/process development, or vocational program  Occupational History   Not on file  Tobacco Use   Smoking status: Former    Current packs/day: 0.00    Types: Cigarettes    Quit date: 06/17/2012    Years since quitting: 11.2   Smokeless tobacco: Never  Vaping Use   Vaping status: Former  Substance and Sexual Activity   Alcohol use: Yes    Comment: occ   Drug use: No   Sexual activity: Not on file  Other Topics Concern   Not on file  Social History Narrative   Not on file   Social Drivers of Health   Financial Resource Strain: Low Risk  (08/16/2023)   Overall Financial Resource Strain (CARDIA)    Difficulty of Paying Living Expenses: Not hard at all  Food Insecurity: No Food Insecurity (08/16/2023)   Hunger Vital Sign    Worried About Running Out of Food in the Last Year: Never true    Ran Out of Food in the Last Year: Never true  Transportation Needs: No Transportation Needs (08/16/2023)   PRAPARE - Administrator, Civil Service (Medical): No    Lack of Transportation (Non-Medical): No  Physical Activity: Sufficiently Active (08/16/2023)   Exercise Vital Sign    Days of Exercise per Week: 5 days    Minutes of Exercise per Session: 30 min  Stress: No Stress Concern Present (08/16/2023)   [employer] of Occupational Health - Occupational Stress Questionnaire    Feeling of Stress : Not at all  Social Connections: Socially Isolated  (08/16/2023)   Social Connection and Isolation Panel [NHANES]    Frequency of Communication with Friends and Family: More than three times a week    Frequency of Social Gatherings with Friends and Family: Three times a week    Attends Religious Services: Never    Active Member of Clubs or Organizations: No    Attends Banker Meetings: Not on file    Marital Status: Divorced  Intimate Partner Violence: Not At Risk (08/18/2022)   Humiliation, Afraid, Rape, and Kick questionnaire  Fear of Current or Ex-Partner: No    Emotionally Abused: No    Physically Abused: No    Sexually Abused: No    Review of Systems:    Constitutional: No weight loss, fever, chills, weakness or fatigue HEENT: Eyes: No change in vision               Ears, Nose, Throat:  No change in hearing or congestion Skin: No rash or itching Cardiovascular: No chest pain, chest pressure or palpitations   Respiratory: No SOB or cough Gastrointestinal: See HPI and otherwise negative Genitourinary: No dysuria or change in urinary frequency Neurological: No headache, dizziness or syncope Musculoskeletal: No new muscle or joint pain Hematologic: No bleeding or bruising Psychiatric: No history of depression or anxiety    Physical Exam:  Vital signs: Ht 5\' 5"  (1.651 m)   Wt 285 lb (129.3 kg)   LMP 12/24/2010   BMI 47.43 kg/m   Constitutional: NAD, Well developed, Well nourished, alert and cooperative Head:  Normocephalic and atraumatic. Eyes:   PEERL, EOMI. No icterus. Conjunctiva pink. Respiratory: Respirations even and unlabored. Lungs clear to auscultation bilaterally.   No wheezes, crackles, or rhonchi.  Cardiovascular:  Regular rate and rhythm. No peripheral edema, cyanosis or pallor.  Gastrointestinal:  Soft, nondistended, nontender. No rebound or guarding. Normal bowel sounds. No appreciable masses or hepatomegaly. Rectal:  Not performed.  Msk:  Symmetrical without gross deformities. Without edema,  no deformity or joint abnormality.  Neurologic:  Alert and  oriented x4;  grossly normal neurologically.  Skin:   Dry and intact without significant lesions or rashes. Psychiatric: Oriented to person, place and time. Demonstrates good judgement and reason without abnormal affect or behaviors.   RELEVANT LABS AND IMAGING: CBC    Component Value Date/Time   WBC 11.0 (H) 08/17/2023 1514   RBC 4.44 08/17/2023 1514   HGB 13.1 08/17/2023 1514   HCT 39.9 08/17/2023 1514   PLT 268.0 08/17/2023 1514   MCV 90.0 08/17/2023 1514   MCH 29.6 09/11/2022 1411   MCHC 32.9 08/17/2023 1514   RDW 14.4 08/17/2023 1514   LYMPHSABS 3.3 08/17/2023 1514   MONOABS 0.5 08/17/2023 1514   EOSABS 0.3 08/17/2023 1514   BASOSABS 0.1 08/17/2023 1514    CMP     Component Value Date/Time   NA 138 08/17/2023 1514   K 4.2 08/17/2023 1514   CL 104 08/17/2023 1514   CO2 24 08/17/2023 1514   GLUCOSE 92 08/17/2023 1514   BUN 22 08/17/2023 1514   CREATININE 1.03 08/17/2023 1514   CREATININE 1.21 (H) 09/11/2022 1411   CALCIUM 10.1 08/17/2023 1514   PROT 7.5 08/17/2023 1514   PROT 7.1 02/04/2023 0847   ALBUMIN 4.4 08/17/2023 1514   ALBUMIN 4.4 02/04/2023 0847   AST 26 08/17/2023 1514   ALT 10 08/17/2023 1514   ALKPHOS 100 08/17/2023 1514   BILITOT 0.4 08/17/2023 1514   BILITOT 0.3 02/04/2023 0847   GFRNONAA >60 11/22/2018 1118   GFRAA >60 11/22/2018 1118     Assessment/Plan:   Lower abdominal pain Diarrhea Intermittent episodes of lower abdominal cramping and diarrhea every 2 to 3 weeks without identifiable trigger. This causes her significant distress and she was tearful during our visit. Recent colonoscopy with tubular adenoma with high-grade dysplasia and a recall of 3 years.  She does have history of diffuse infiltration of small bowel mesentery consistent with fibrosing mesenteritis on CT scan in 2020.  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

## 2023-08-30 NOTE — Patient Instructions (Signed)
 We have sent the following medications to your pharmacy for you to pick up at your convenience: Dicyclomine 10mg   You have been scheduled for a CT scan of the abdomen and pelvis at [facility], 1st floor Radiology. You are scheduled on Thursday 09/16/23 arrive at 11:45.   1) Do not eat anything after 7:45am (4 hours prior to your test)   You may take any medications as prescribed with a small amount of water, if necessary. If you take any of the following medications: METFORMIN, GLUCOPHAGE, GLUCOVANCE, AVANDAMET, RIOMET, FORTAMET, ACTOPLUS MET, JANUMET, GLUMETZA or METAGLIP, you MAY be asked to HOLD this medication 48 hours AFTER the exam.   The purpose of you drinking the oral contrast is to aid in the visualization of your intestinal tract. The contrast solution may cause some diarrhea. Depending on your individual set of symptoms, you may also receive an intravenous injection of x-ray contrast/dye. Plan on being at [facility] for 45 minutes or longer, depending on the type of exam you are having performed.   If you have any questions regarding your exam or if you need to reschedule, you may call [facility] at [phone] between the hours of 8:00 am and 5:00 pm, Monday-Friday.   Please purchase the following medications over the counter and take as directed: IBgard   _______________________________________________________  If your blood pressure at your visit was 140/90 or greater, please contact your primary care physician to follow up on this.  _______________________________________________________  If you are age 60 or older, your body mass index should be between 23-30. Your Body mass index is 47.43 kg/m. If this is out of the aforementioned range listed, please consider follow up with your Primary Care Provider.  If you are age 45 or younger, your body mass index should be between 19-25. Your Body mass index is 47.43 kg/m. If this is out of the aformentioned  range listed, please consider follow up with your Primary Care Provider.   ________________________________________________________  The [facility] providers would like to encourage you to use [facility] to communicate with providers for non-urgent requests or questions.  Due to long hold times on the telephone, sending your provider a message by [facility] may be a faster and more efficient way to get a response.  Please allow 48 business hours for a response.  Please remember that this is for non-urgent requests.  _______________________________________________________

## 2023-08-30 NOTE — Progress Notes (Signed)
 Agree with assessment and plan as outlined.

## 2023-08-31 ENCOUNTER — Ambulatory Visit (INDEPENDENT_AMBULATORY_CARE_PROVIDER_SITE_OTHER): Admitting: Family Medicine

## 2023-08-31 ENCOUNTER — Other Ambulatory Visit (INDEPENDENT_AMBULATORY_CARE_PROVIDER_SITE_OTHER)

## 2023-08-31 ENCOUNTER — Encounter: Payer: Self-pay | Admitting: Family Medicine

## 2023-08-31 ENCOUNTER — Encounter: Payer: Self-pay | Admitting: Family

## 2023-08-31 ENCOUNTER — Other Ambulatory Visit: Payer: Self-pay | Admitting: Family

## 2023-08-31 VITALS — BP 128/88 | HR 73 | Temp 98.2°F | Ht 65.0 in | Wt 282.6 lb

## 2023-08-31 DIAGNOSIS — W57XXXA Bitten or stung by nonvenomous insect and other nonvenomous arthropods, initial encounter: Secondary | ICD-10-CM | POA: Diagnosis not present

## 2023-08-31 DIAGNOSIS — R350 Frequency of micturition: Secondary | ICD-10-CM

## 2023-08-31 DIAGNOSIS — S80861A Insect bite (nonvenomous), right lower leg, initial encounter: Secondary | ICD-10-CM | POA: Diagnosis not present

## 2023-08-31 DIAGNOSIS — L03115 Cellulitis of right lower limb: Secondary | ICD-10-CM | POA: Diagnosis not present

## 2023-08-31 DIAGNOSIS — W57XXXS Bitten or stung by nonvenomous insect and other nonvenomous arthropods, sequela: Secondary | ICD-10-CM | POA: Diagnosis not present

## 2023-08-31 DIAGNOSIS — R899 Unspecified abnormal finding in specimens from other organs, systems and tissues: Secondary | ICD-10-CM

## 2023-08-31 LAB — CBC WITH DIFFERENTIAL/PLATELET
Basophils Absolute: 0.1 10*3/uL (ref 0.0–0.1)
Basophils Relative: 0.8 % (ref 0.0–3.0)
Eosinophils Absolute: 0.2 10*3/uL (ref 0.0–0.7)
Eosinophils Relative: 2.2 % (ref 0.0–5.0)
HCT: 39.5 % (ref 36.0–46.0)
Hemoglobin: 12.8 g/dL (ref 12.0–15.0)
Lymphocytes Relative: 31.9 % (ref 12.0–46.0)
Lymphs Abs: 2.5 10*3/uL (ref 0.7–4.0)
MCHC: 32.3 g/dL (ref 30.0–36.0)
MCV: 90.5 fl (ref 78.0–100.0)
Monocytes Absolute: 0.4 10*3/uL (ref 0.1–1.0)
Monocytes Relative: 5.1 % (ref 3.0–12.0)
Neutro Abs: 4.7 10*3/uL (ref 1.4–7.7)
Neutrophils Relative %: 60 % (ref 43.0–77.0)
Platelets: 241 10*3/uL (ref 150.0–400.0)
RBC: 4.37 Mil/uL (ref 3.87–5.11)
RDW: 14.1 % (ref 11.5–15.5)
WBC: 7.8 10*3/uL (ref 4.0–10.5)

## 2023-08-31 LAB — POCT URINALYSIS DIPSTICK
Bilirubin, UA: NEGATIVE
Blood, UA: NEGATIVE
Glucose, UA: NEGATIVE
Leukocytes, UA: NEGATIVE
Nitrite, UA: NEGATIVE
Protein, UA: NEGATIVE
Spec Grav, UA: 1.015 (ref 1.010–1.025)
Urobilinogen, UA: 0.2 U/dL
pH, UA: 5 (ref 5.0–8.0)

## 2023-08-31 MED ORDER — CEPHALEXIN 500 MG PO CAPS: 500.0000 mg | ORAL_CAPSULE | Freq: Three times a day (TID) | ORAL | 0 refills | Status: AC

## 2023-08-31 NOTE — Progress Notes (Signed)
 Chief Complaint  Patient presents with   Acute Visit    Patient presents today for right left check. She report possible urinary tract infection.    Stephanie Hensley is a 63 y.o. female here for a skin complaint.  Duration: a few weeks Location: RLE Pruritic? No Painful? Yes Drainage? No Trauma? No Other associated symptoms: no fevers or spreading redness Therapies tried thus far: doxycycline d 8/10, improving but still having s/ss  UTI Over the past 2 days, the patient has had burning with urination, urgency, and change in mood.  These are typical symptoms for her when she has a urinary tract infection.  Denies any bleeding, discharge, new abdominal pain, nausea, vomiting, fevers, or new sexual partners.  Past Medical History:  Diagnosis Date   Arthritis    Asthma    Cataract    Bilteral removed   Headache    migraines in the past   Hyperlipidemia    Morbid (severe) obesity due to excess calories (HCC)    PTSD (post-traumatic stress disorder)     BP 128/88   Pulse 73   Temp 98.2 F (36.8 C)   Ht 5\' 5"  (1.651 m)   Wt 282 lb 9.6 oz (128.2 kg)   LMP 12/24/2010   SpO2 98%   BMI 47.03 kg/m  Gen: awake, alert, appearing stated age Heart: RRR Abdomen: Bowel sounds present, soft, nontender, nondistended Lungs: CTAB.  No accessory muscle use Skin: See below.  Mild TTP.  No drainage, fluctuance, excoriation Psych: Age appropriate judgment and insight    Frequent urination - Plan: POCT urinalysis dipstick, cephALEXin (KEFLEX) 500 MG capsule, Urine Culture  Cellulitis of right leg  Keflex 500 mg 3 times daily for 7 days.  Check culture. She will finish her doxycycline.  Keflex as above should help clear any residual infection.  Continue to elevate legs. F/u prn. The patient voiced understanding and agreement to the plan.  Jilda Roche [place], DO 08/31/23 12:29 PM

## 2023-08-31 NOTE — Patient Instructions (Signed)
 Stay hydrated.   Warning signs/symptoms: Uncontrollable nausea/vomiting, fevers, worsening symptoms despite treatment, confusion.  Give [place] around 2 business days to get culture back to you.  Let us know if you need anything.

## 2023-09-01 ENCOUNTER — Encounter: Payer: Self-pay | Admitting: Family Medicine

## 2023-09-01 LAB — URINE CULTURE
MICRO NUMBER:: 16244535
Result:: NO GROWTH
SPECIMEN QUALITY:: ADEQUATE

## 2023-09-02 ENCOUNTER — Encounter: Payer: Self-pay | Admitting: Family Medicine

## 2023-09-02 LAB — LYME DISEASE SEROLOGY W/REFLEX: Lyme Total Antibody EIA: NEGATIVE

## 2023-09-09 DIAGNOSIS — K080 Exfoliation of teeth due to systemic causes: Secondary | ICD-10-CM | POA: Diagnosis not present

## 2023-09-14 DIAGNOSIS — L309 Dermatitis, unspecified: Secondary | ICD-10-CM | POA: Diagnosis not present

## 2023-09-14 DIAGNOSIS — L821 Other seborrheic keratosis: Secondary | ICD-10-CM | POA: Diagnosis not present

## 2023-09-16 ENCOUNTER — Other Ambulatory Visit: Payer: Self-pay | Admitting: Family

## 2023-09-16 ENCOUNTER — Ambulatory Visit (HOSPITAL_COMMUNITY)
Admission: RE | Admit: 2023-09-16 | Discharge: 2023-09-16 | Disposition: A | Discharge status: Home / Self Care | Source: Ambulatory Visit | Attending: Gastroenterology | Admitting: Gastroenterology

## 2023-09-16 ENCOUNTER — Encounter: Payer: Self-pay | Admitting: Family Medicine

## 2023-09-16 DIAGNOSIS — D3501 Benign neoplasm of right adrenal gland: Secondary | ICD-10-CM | POA: Diagnosis not present

## 2023-09-16 DIAGNOSIS — R160 Hepatomegaly, not elsewhere classified: Secondary | ICD-10-CM | POA: Diagnosis not present

## 2023-09-16 DIAGNOSIS — K5730 Diverticulosis of large intestine without perforation or abscess without bleeding: Secondary | ICD-10-CM | POA: Diagnosis not present

## 2023-09-16 DIAGNOSIS — R195 Other fecal abnormalities: Secondary | ICD-10-CM | POA: Diagnosis not present

## 2023-09-16 DIAGNOSIS — R1030 Lower abdominal pain, unspecified: Secondary | ICD-10-CM | POA: Diagnosis not present

## 2023-09-16 DIAGNOSIS — D3502 Benign neoplasm of left adrenal gland: Secondary | ICD-10-CM | POA: Diagnosis not present

## 2023-09-16 MED ORDER — IOHEXOL 9 MG/ML PO SOLN
1000.0000 mL | ORAL | Status: AC
  Administered 2023-09-16: 1000 mL via ORAL

## 2023-09-16 MED ORDER — SODIUM CHLORIDE (PF) 0.9 % IJ SOLN
INTRAMUSCULAR | Status: AC
  Filled 2023-09-16: qty 50

## 2023-09-16 MED ORDER — IOHEXOL 9 MG/ML PO SOLN
ORAL | Status: AC
  Filled 2023-09-16: qty 1000

## 2023-09-16 MED ORDER — IOHEXOL 300 MG/ML  SOLN
100.0000 mL | Freq: Once | INTRAMUSCULAR | Status: AC | PRN
  Administered 2023-09-16: 100 mL via INTRAVENOUS

## 2023-09-16 MED ORDER — ALBUTEROL SULFATE HFA 108 (90 BASE) MCG/ACT IN AERS
1.0000 | INHALATION_SPRAY | Freq: Four times a day (QID) | RESPIRATORY_TRACT | 5 refills | Status: AC | PRN
Start: 2023-09-16 — End: ?

## 2023-09-28 ENCOUNTER — Encounter: Payer: Self-pay | Admitting: Gastroenterology

## 2023-09-29 ENCOUNTER — Telehealth: Payer: Self-pay | Admitting: Family

## 2023-09-29 NOTE — Telephone Encounter (Signed)
 Pt called. I advised Dr.Wendling is limiting his new pt intake, and we do not want her to feel uncomfortable with the care she is receiving. I explained there are plenty of other primary care facilities within [facility] and [place]. She stated she did not want to travel to [place] or [place]. Also so stated she did not understand why she could not est with Dr.Wendling as she has seen him twice. I explained again he did not accept the transfer of care. She stated "So am I just going to have to keep seeing her? I F'ING HATE HER!!!" I explained again we do not want her to feel uncomfortable with her pcp and we have plenty of offices she could est at. She then said "So now you're going to keep me from seeing Rice Chamorro??" and disconnected the call.

## 2023-09-29 NOTE — Telephone Encounter (Signed)
 Copied from CRM [id number]. Topic: Appointments - Transfer of Care >> Sep 29, 2023  9:29 AM Allyne Areola wrote: Pt is requesting to transfer FROM: Glade Lambert Pt is requesting to transfer TO: Dawna Etienne   Reason for requested transfer: Does not like the care being provided by Glade Lambert It is the responsibility of the team the patient would like to transfer to (Dr. Gwenette Lennox) to reach out to the patient if for any reason this transfer is not acceptable.

## 2023-09-29 NOTE — Telephone Encounter (Signed)
 No. Greatly reducing new patients currently. Thx.

## 2023-10-01 ENCOUNTER — Other Ambulatory Visit: Payer: Self-pay | Admitting: Family

## 2023-10-04 ENCOUNTER — Ambulatory Visit: Admitting: Physician Assistant

## 2023-10-13 ENCOUNTER — Other Ambulatory Visit: Payer: Self-pay | Admitting: Family

## 2023-10-19 DIAGNOSIS — I10 Essential (primary) hypertension: Secondary | ICD-10-CM | POA: Diagnosis not present

## 2023-10-19 DIAGNOSIS — R2689 Other abnormalities of gait and mobility: Secondary | ICD-10-CM | POA: Diagnosis not present

## 2023-10-19 DIAGNOSIS — E66813 Obesity, class 3: Secondary | ICD-10-CM | POA: Diagnosis not present

## 2023-10-19 DIAGNOSIS — R7303 Prediabetes: Secondary | ICD-10-CM | POA: Diagnosis not present

## 2023-10-19 DIAGNOSIS — L03115 Cellulitis of right lower limb: Secondary | ICD-10-CM | POA: Diagnosis not present

## 2023-10-21 DIAGNOSIS — G8929 Other chronic pain: Secondary | ICD-10-CM | POA: Diagnosis not present

## 2023-10-21 DIAGNOSIS — M25512 Pain in left shoulder: Secondary | ICD-10-CM | POA: Diagnosis not present

## 2023-10-26 ENCOUNTER — Ambulatory Visit: Admitting: Gastroenterology

## 2023-11-10 DIAGNOSIS — M25512 Pain in left shoulder: Secondary | ICD-10-CM | POA: Diagnosis not present

## 2023-11-18 DIAGNOSIS — Z0289 Encounter for other administrative examinations: Secondary | ICD-10-CM

## 2023-11-25 ENCOUNTER — Encounter: Payer: Self-pay | Admitting: Nurse Practitioner

## 2023-11-25 ENCOUNTER — Ambulatory Visit (INDEPENDENT_AMBULATORY_CARE_PROVIDER_SITE_OTHER): Admitting: Nurse Practitioner

## 2023-11-25 VITALS — BP 130/77 | HR 76 | Temp 98.3°F | Ht 65.0 in | Wt 261.0 lb

## 2023-11-25 DIAGNOSIS — E66813 Obesity, class 3: Secondary | ICD-10-CM | POA: Diagnosis not present

## 2023-11-25 DIAGNOSIS — I10 Essential (primary) hypertension: Secondary | ICD-10-CM | POA: Diagnosis not present

## 2023-11-25 DIAGNOSIS — R7303 Prediabetes: Secondary | ICD-10-CM

## 2023-11-25 DIAGNOSIS — E785 Hyperlipidemia, unspecified: Secondary | ICD-10-CM

## 2023-11-25 DIAGNOSIS — Z6841 Body Mass Index (BMI) 40.0 and over, adult: Secondary | ICD-10-CM

## 2023-11-25 NOTE — Progress Notes (Signed)
 Office: [phone]  /  Fax: [phone]   Initial Visit  Stephanie Hensley was seen in clinic today to evaluate for obesity. She is interested in losing weight to improve overall health and reduce the risk of weight related complications. She presents today to review program treatment options, initial physical assessment, and evaluation.     She was referred by: Friend or Family  When asked what else they would like to accomplish? She states: Adopt healthier eating patterns, Improve energy levels and physical activity, and Improve quality of life   When asked how has your weight affected you? She states: Contributed to medical problems, Having fatigue, and Having poor endurance  Some associated conditions: asthma/allergies, depression, HTN, HLD, prediabetes,   Contributing factors: family history of obesity and moderate to high levels of stress  Weight promoting medications identified: None  Current nutrition plan: high protein, low carb  Current level of physical activity: walking dog daily  Current or previous pharmacotherapy: currently taking compounding segmulatide    Response to medication: losing weight    Past medical history includes:   Past Medical History:  Diagnosis Date   Arthritis    Asthma    Cataract    Bilteral removed   Headache    migraines in the past   Hyperlipidemia    Morbid (severe) obesity due to excess calories (HCC)    PTSD (post-traumatic stress disorder)      Objective:   BP 130/77   Pulse 76   Temp 98.3 F (36.8 C)   Ht 5' 5 (1.651 m)   Wt 261 lb (118.4 kg)   LMP 12/24/2010   SpO2 100%   BMI 43.43 kg/m  She was weighed on the bioimpedance scale: Body mass index is 43.43 kg/m.  Peak Weight:320 lbs , Body Fat%:51.7%, Visceral Fat Rating:18, Weight trend over the last 12 months: Decreasing  General:  Alert, oriented and cooperative. Patient is in no acute distress.  Respiratory: Normal respiratory effort, no problems with  respiration noted   Gait: able to ambulate independently  Mental Status: Normal mood and affect. Normal behavior. Normal judgment and thought content.   DIAGNOSTIC DATA REVIEWED:  BMET    Component Value Date/Time   NA 138 08/17/2023 1514   K 4.2 08/17/2023 1514   CL 104 08/17/2023 1514   CO2 24 08/17/2023 1514   GLUCOSE 92 08/17/2023 1514   BUN 22 08/17/2023 1514   CREATININE 1.03 08/17/2023 1514   CREATININE 1.21 (H) 09/11/2022 1411   CALCIUM  10.1 08/17/2023 1514   GFRNONAA >60 11/22/2018 1118   GFRAA >60 11/22/2018 1118   Lab Results  Component Value Date   HGBA1C 6.1 08/17/2023   HGBA1C 6.0 07/28/2021   No results found for: INSULIN CBC    Component Value Date/Time   WBC 7.8 08/31/2023 1143   RBC 4.37 08/31/2023 1143   HGB 12.8 08/31/2023 1143   HCT 39.5 08/31/2023 1143   PLT 241.0 08/31/2023 1143   MCV 90.5 08/31/2023 1143   MCH 29.6 09/11/2022 1411   MCHC 32.3 08/31/2023 1143   RDW 14.1 08/31/2023 1143   Iron/TIBC/Ferritin/ %Sat No results found for: IRON, TIBC, FERRITIN, IRONPCTSAT Lipid Panel     Component Value Date/Time   CHOL 123 08/17/2023 1514   CHOL 127 02/04/2023 0847   TRIG 149.0 08/17/2023 1514   HDL 57.90 08/17/2023 1514   HDL 56 02/04/2023 0847   CHOLHDL 2 08/17/2023 1514   VLDL 29.8 08/17/2023 1514   LDLCALC 35 08/17/2023 1514  LDLCALC 50 02/04/2023 0847   Hepatic Function Panel     Component Value Date/Time   PROT 7.5 08/17/2023 1514   PROT 7.1 02/04/2023 0847   ALBUMIN 4.4 08/17/2023 1514   ALBUMIN 4.4 02/04/2023 0847   AST 26 08/17/2023 1514   ALT 10 08/17/2023 1514   ALKPHOS 100 08/17/2023 1514   BILITOT 0.4 08/17/2023 1514   BILITOT 0.3 02/04/2023 0847   BILIDIR 0.11 02/04/2023 0847   No results found for: TSH   Assessment and Plan:   Prediabetes Will continue to monitor  Hyperlipidemia, unspecified hyperlipidemia type Keep follow up appt with cardiology.  Continue meds as directed    Hypertension,  unspecified type Keep follow up appt with cardiology.  Continue meds as directed    Class 3 severe obesity due to excess calories with body mass index (BMI) of 40.0 to 44.9 in adult, unspecified whether serious comorbidity present        Obesity Treatment / Action Plan:  Patient will work on garnering support from family and friends to begin weight loss journey. Will work on eliminating or reducing the presence of highly palatable, calorie dense foods in the home. Will complete provided nutritional and psychosocial assessment questionnaire before the next appointment. Will be scheduled for indirect calorimetry to determine resting energy expenditure in a fasting state.  This will allow us  to create a reduced calorie, high-protein meal plan to promote loss of fat mass while preserving muscle mass. Counseled on the health benefits of losing 5%-15% of total body weight. Was counseled on nutritional approaches to weight loss and benefits of reducing processed foods and consuming plant-based foods and high quality protein as part of nutritional weight management. Was counseled on pharmacotherapy and role as an adjunct in weight management.   Obesity Education Performed Today:  She was weighed on the bioimpedance scale and results were discussed and documented in the synopsis.  We discussed obesity as a disease and the importance of a more detailed evaluation of all the factors contributing to the disease.  We discussed the importance of long term lifestyle changes which include nutrition, exercise and behavioral modifications as well as the importance of customizing this to her specific health and social needs.  We discussed the benefits of reaching a healthier weight to alleviate the symptoms of existing conditions and reduce the risks of the biomechanical, metabolic and psychological effects of obesity.  Stephanie Hensley appears to be in the action stage of change and states they are ready to  start intensive lifestyle modifications and behavioral modifications.  30 minutes was spent today on this visit including the above counseling, pre-visit chart review, and post-visit documentation.  Reviewed by clinician on day of visit: allergies, medications, problem list, medical history, surgical history, family history, social history, and previous encounter notes pertinent to obesity diagnosis.    Crist Dominion Earleen Aoun FNP-C

## 2023-12-08 ENCOUNTER — Encounter: Payer: Self-pay | Admitting: Bariatrics

## 2023-12-08 ENCOUNTER — Ambulatory Visit: Admitting: Bariatrics

## 2023-12-08 VITALS — BP 140/82 | HR 75 | Temp 97.5°F | Ht 65.0 in | Wt 257.0 lb

## 2023-12-08 DIAGNOSIS — E559 Vitamin D deficiency, unspecified: Secondary | ICD-10-CM | POA: Diagnosis not present

## 2023-12-08 DIAGNOSIS — R7303 Prediabetes: Secondary | ICD-10-CM | POA: Diagnosis not present

## 2023-12-08 DIAGNOSIS — R0602 Shortness of breath: Secondary | ICD-10-CM

## 2023-12-08 DIAGNOSIS — Z1331 Encounter for screening for depression: Secondary | ICD-10-CM

## 2023-12-08 DIAGNOSIS — I10 Essential (primary) hypertension: Secondary | ICD-10-CM | POA: Diagnosis not present

## 2023-12-08 DIAGNOSIS — Z0000 Encounter for general adult medical examination without abnormal findings: Secondary | ICD-10-CM

## 2023-12-08 DIAGNOSIS — Z6841 Body Mass Index (BMI) 40.0 and over, adult: Secondary | ICD-10-CM

## 2023-12-08 DIAGNOSIS — E785 Hyperlipidemia, unspecified: Secondary | ICD-10-CM

## 2023-12-08 DIAGNOSIS — R5383 Other fatigue: Secondary | ICD-10-CM | POA: Diagnosis not present

## 2023-12-08 DIAGNOSIS — E66813 Body mass index (BMI) 40.0-44.9, adult: Secondary | ICD-10-CM

## 2023-12-08 NOTE — Progress Notes (Signed)
 At a Glance:  Vitals Temp: (!) 97.5 F (36.4 C) BP: (!) 140/82 Pulse Rate: 75 SpO2: 98 %   Anthropometric Measurements Height: 5' 5 (1.651 m) Weight: 257 lb (116.6 kg) BMI (Calculated): 42.77 Starting Weight: 257lb Peak Weight: 320lb   Body Composition  Body Fat %: 50.4 % Fat Mass (lbs): 129.8 lbs Muscle Mass (lbs): 121.2 lbs Visceral Fat Rating : 17   Other Clinical Data RMR: 2592 Fasting: yes Labs: yes Today's Visit #: 1 Starting Date: 12/08/23    EKG: Normal sinus rhythm, rate 77.  Indirect Calorimeter:   Resting Metabolic Rate ( RMR):  RMR (actual): 2592 kcal RMR (calculated): 1822 kcal The calculated basal metabolic rate is 8,177 kcal thus her basal metabolic rate is better than expected.  Plan:   Indirect calorimeter completed, interpreted and reviewed with patient today and allowed to ask questions.  Discussed the implications for the chosen plan and exercise based on the RMR reading.  Will consider repeating the RMR in the future based on weight loss.    Chief Complaint:  Obesity   Subjective:  Stephanie Hensley (MR# [id number]) is a 63 y.o. female who presents for evaluation and treatment of obesity and related comorbidities.   Stephanie Hensley is currently in the action stage of change and ready to dedicate time achieving and maintaining a healthier weight. Stephanie Hensley is interested in becoming our patient and working on intensive lifestyle modifications including (but not limited to) diet and exercise for weight loss.  Stephanie Hensley has been struggling with her weight. She has been unsuccessful in either losing weight, maintaining weight loss, or reaching her healthy weight goal.  Stephanie Hensley habits were reviewed today and are as follows: she thinks her family will eat healthier with her, she has been heavy most of her life, she is a picky eater and doesn't like to eat healthier foods, and she has significant food cravings issues.  Current or previous  pharmacotherapy: currently taking compounding Segmulatide    Response to medication: losing weight   Other Fatigue Stephanie Hensley denies daytime somnolence and denies waking up still tired. Stephanie Hensley generally gets 8 hours of sleep per night, and states that she has generally restful sleep. Snoring may be  present. Apneic episodes is not present. Epworth Sleepiness Score is 4.   Shortness of Breath Stephanie Hensley notes increasing shortness of breath with exercising and seems to be worsening over time with weight gain. She notes getting out of breath sooner with activity than she used to. This has not gotten worse recently. Stephanie Hensley denies shortness of breath at rest or orthopnea.  Depression Screen Stephanie Hensley's Food and Mood (modified PHQ-9) score was 4. <5 no depression     11/11/2022    2:51 PM  Depression screen PHQ 2/9  Decreased Interest 0  Down, Depressed, Hopeless 0  PHQ - 2 Score 0  Altered sleeping 0  Tired, decreased energy 0  Change in appetite 0  Feeling bad or failure about yourself  0  Trouble concentrating 0  Moving slowly or fidgety/restless 0  Suicidal thoughts 0  PHQ-9 Score 0     Assessment and Plan:   Other Fatigue Stephanie Hensley does feel that her weight is causing her energy to be lower than it should be. Fatigue may be related to obesity, depression or many other causes. Labs will be ordered, and in the meanwhile, Stephanie Hensley will focus on self care including making healthy food choices, increasing physical activity and focusing on stress reduction.  Shortness of Breath Stephanie Hensley does feel  that she gets out of breath more easily that she used to when she exercises. Stephanie Hensley's shortness of breath appears to be obesity related and exercise induced. She has agreed to work on weight loss and gradually increase exercise to treat her exercise induced shortness of breath. Will continue to monitor closely.  Health Maintenance:   Obesity   Plan: Will do EKG, indirect calorimetry, and  labs.     Vitamin D Deficiency She is at risk for vitamin D deficiency due to obesity.  She is on MV and other supplements.   Plan: Will check for vitamin D deficiency.   Hypertension Hypertension stable.  Medication(s): none  BP Readings from Last 3 Encounters:  12/08/23 (!) 140/82  11/25/23 130/77  08/31/23 128/88   Lab Results  Component Value Date   CREATININE 1.03 08/17/2023   CREATININE 1.15 02/04/2023   CREATININE 1.21 (H) 09/11/2022   Lab Results  Component Value Date   GFR 59.08 (L) 08/17/2023   GFR 51.96 (L) 02/04/2023   GFR 55.39 (L) 07/28/2021    Plan: Continue all antihypertensives at current dosages. No added salt. Will keep sodium content to 1,500 mg or less per day.    Hyperlipidemia LDL is at goal. Medication(s): Repatha  Cardiovascular risk factors: dyslipidemia, hypertension, obesity (BMI >= 30 kg/m2), and sedentary lifestyle  Lab Results  Component Value Date   CHOL 123 08/17/2023   HDL 57.90 08/17/2023   LDLCALC 35 08/17/2023   TRIG 149.0 08/17/2023   CHOLHDL 2 08/17/2023   Lab Results  Component Value Date   ALT 10 08/17/2023   AST 26 08/17/2023   ALKPHOS 100 08/17/2023   BILITOT 0.4 08/17/2023   The ASCVD Risk score (Arnett DK, et al., 2019) failed to calculate for the following reasons:   The valid total cholesterol range is 130 to 320 mg/dL  Plan:  Continue Repatha .  Information sheet on healthy vs unhealthy fats.  Will avoid all trans fats.  Will read labels Will minimize saturated fats except the following: low fat meats in moderation, diary, and limited dark chocolate.   Prediabetes Last A1c was 5.8  Medication(s): none Lab Results  Component Value Date   HGBA1C 6.1 08/17/2023   HGBA1C 5.9 (H) 09/11/2022   HGBA1C 5.8 (H) 10/30/2021   HGBA1C 6.0 07/28/2021   No results found for: INSULIN  Plan: Will minimize all refined carbohydrates both sweets and starches.  Will work on the plan and exercise.  Consider  both aerobic and resistance training.  Will keep protein, water, and fiber intake high.  Increase Polyunsaturated and Monounsaturated fats to increase satiety and encourage weight loss.  Aim for 7 to 9 hours of sleep nightly.   Previous labs reviewed today. Date: 10/19/23 and 08/17/23.  CMP, Lipids, HgbA1c, and glucose and CBC, A1c,    Labs done today Insulin, Vit D, and CBC   Morbid Obesity: BMI (Calculated): 42.77   Maimouna is currently in the action stage of change and her goal is to begin weight loss efforts. I recommend Amaka begin the structured treatment plan as follows:  She has agreed to Category 4 Plan and keeping a food journal and adhering to recommended goals of 1,800 to 2,000 calories and 80 grams of  protein ( lowered the amount of protein slightly due to current kidney function).   Exercise goals: All adults should avoid inactivity. Some activity is better than none, and adults who participate in any amount of physical activity, gain some health benefits.  Behavioral modification  strategies:increasing lean protein intake, increasing vegetables, increase H2O intake, increase high fiber foods, no skipping meals, meal planning and cooking strategies, better snacking choices, and avoiding temptations  She was informed of the importance of frequent follow-up visits to maximize her success with intensive lifestyle modifications for her multiple health conditions. She was informed we would discuss her lab results at her next visit unless there is a critical issue that needs to be addressed sooner. Annelise agreed to keep her next visit at the agreed upon time to discuss these results.  Objective:  General: Cooperative, alert, well developed, in no acute distress. HEENT: Conjunctivae and lids unremarkable. Cardiovascular: Regular rhythm.  Lungs: Normal work of breathing. Neurologic: No focal deficits.   Lab Results  Component Value Date   CREATININE 1.03 08/17/2023    BUN 22 08/17/2023   NA 138 08/17/2023   K 4.2 08/17/2023   CL 104 08/17/2023   CO2 24 08/17/2023   Lab Results  Component Value Date   ALT 10 08/17/2023   AST 26 08/17/2023   ALKPHOS 100 08/17/2023   BILITOT 0.4 08/17/2023   Lab Results  Component Value Date   HGBA1C 6.1 08/17/2023   HGBA1C 5.9 (H) 09/11/2022   HGBA1C 5.8 (H) 10/30/2021   HGBA1C 6.0 07/28/2021   No results found for: INSULIN No results found for: TSH Lab Results  Component Value Date   CHOL 123 08/17/2023   HDL 57.90 08/17/2023   LDLCALC 35 08/17/2023   TRIG 149.0 08/17/2023   CHOLHDL 2 08/17/2023   Lab Results  Component Value Date   WBC 7.8 08/31/2023   HGB 12.8 08/31/2023   HCT 39.5 08/31/2023   MCV 90.5 08/31/2023   PLT 241.0 08/31/2023   No results found for: IRON, TIBC, FERRITIN  Attestation Statements:  Applicable history such as the following:  allergies, medications, problem list, medical history, surgical history, family history, social history, and previous encounter notes reviewed by clinician on day of visit:  Time spent on visit in care of the patient today including the items listed below was 60 minutes.    30 minutes were spent talking about the history, 20 minutes for face to face counseling implementing the plan, discussing the specifics of how to arrange meals, meal planning, water intake.   I spent face to face time discussing his/her plan, including breakfast, additional breakfast options, lunch, and dinner options, grocery list, and snacks.  I reviewed her indirect calorimetry. I discussed the implications for the diet plan.    Discussed the bio-impedence test (fat %, muscle mass, and water weight) and allowed the patient to ask questions.   Discussed the following information sheets: Category 4, Grocery List, 100 Calorie Snacks, 200 Calorie Snacks, Microwave Meals, and high-protein choices, protein substitutions, journaling sheet, and information about using an  application to track food.   I reviewed the labs which were ordered from her visits on 03/25 and 05/25. ,   I additionally spent time documenting, reviewing, and checking the codes before submitting.   This may have been prepared with the assistance of Engineer, civil (consulting).  Occasional wrong-word or sound-a-like substitutions may have occurred due to the inherent limitations of voice recognition software.    Clayborne Daring, DO

## 2023-12-09 LAB — CBC WITH DIFFERENTIAL/PLATELET
Basophils Absolute: 0.1 10*3/uL (ref 0.0–0.2)
Basos: 1 %
EOS (ABSOLUTE): 0.2 10*3/uL (ref 0.0–0.4)
Eos: 2 %
Hematocrit: 41.2 % (ref 34.0–46.6)
Hemoglobin: 13.1 g/dL (ref 11.1–15.9)
Immature Grans (Abs): 0 10*3/uL (ref 0.0–0.1)
Immature Granulocytes: 0 %
Lymphocytes Absolute: 2.9 10*3/uL (ref 0.7–3.1)
Lymphs: 26 %
MCH: 28.9 pg (ref 26.6–33.0)
MCHC: 31.8 g/dL (ref 31.5–35.7)
MCV: 91 fL (ref 79–97)
Monocytes Absolute: 0.5 10*3/uL (ref 0.1–0.9)
Monocytes: 5 %
Neutrophils Absolute: 7.4 10*3/uL — ABNORMAL HIGH (ref 1.4–7.0)
Neutrophils: 66 %
Platelets: 263 10*3/uL (ref 150–450)
RBC: 4.53 x10E6/uL (ref 3.77–5.28)
RDW: 12.3 % (ref 11.7–15.4)
WBC: 11.1 10*3/uL — ABNORMAL HIGH (ref 3.4–10.8)

## 2023-12-09 LAB — INSULIN, RANDOM: INSULIN: 20.6 u[IU]/mL (ref 2.6–24.9)

## 2023-12-09 LAB — VITAMIN D 25 HYDROXY (VIT D DEFICIENCY, FRACTURES): Vit D, 25-Hydroxy: 58.7 ng/mL (ref 30.0–100.0)

## 2023-12-14 NOTE — Progress Notes (Signed)
 HPI: FU coronary calcification and hyperlipidemia.  Calcium  score April 2023 351 which was 98th percentile.  Carotid Dopplers April 2023 near normal bilaterally.  Since last seen the patient denies any dyspnea on exertion, orthopnea, PND, pedal edema, palpitations, syncope or chest pain.  Notes she has lost 31 pounds recently with changing her diet and GLP-1 agonist.   Current Outpatient Medications  Medication Sig Dispense Refill   acetaminophen  (TYLENOL ) 500 MG tablet Take 500 mg by mouth every 6 (six) hours as needed.     albuterol  (VENTOLIN  HFA) 108 (90 Base) MCG/ACT inhaler Inhale 1-2 puffs into the lungs every 6 (six) hours as needed for wheezing or shortness of breath. 18 g 5   Calcium -Magnesium-Vitamin D  (CITRACAL SLOW RELEASE PO) Take by mouth.     cetirizine (ZYRTEC) 10 MG tablet Take 10 mg by mouth daily.     ezetimibe  (ZETIA ) 10 MG tablet Take 1 tablet (10 mg total) by mouth daily. 90 tablet 0   Ferrous Sulfate (SLOW RELEASE IRON PO) Take by mouth. Twice a week     hydrochlorothiazide  (MICROZIDE ) 12.5 MG capsule Take 1 capsule (12.5 mg total) by mouth daily. 90 capsule 0   Magnesium 250 MG TABS Take by mouth.     Multiple Vitamins-Minerals (ALIVE WOMENS 50+) CHEW Chew by mouth.     REPATHA  SURECLICK 140 MG/ML SOAJ ADMINISTER 1 ML UNDER THE SKIN EVERY 14 DAYS 6 mL 3   No current facility-administered medications for this visit.     Past Medical History:  Diagnosis Date   Arthritis    Asthma    Cataract    Bilteral removed   Headache    migraines in the past   Hyperlipidemia    IBS (irritable bowel syndrome)    Joint pain    Morbid (severe) obesity due to excess calories (HCC)    Pre-diabetes    PTSD (post-traumatic stress disorder)     Past Surgical History:  Procedure Laterality Date   cataractremopval     CHOLECYSTECTOMY     HERNIA REPAIR     umbilical hernia repair   KNEE SURGERY     KNEE SURGERY Left 2002   Right leg surgery  2021   SHOULDER  HEMI-ARTHROPLASTY Left 06/18/2016   Procedure: LEFT SHOULDER HEMI-ARTHROPLASTY;  Surgeon: Selinda Belvie Gosling, MD;  Location: [facility] OR;  Service: Orthopedics;  Laterality: Left;   TUBAL LIGATION      Social History   Socioeconomic History   Marital status: Single    Spouse name: Not on file   Number of children: 2   Years of education: Not on file   Highest education level: Associate degree: occupational, Scientist, product/process development, or vocational program  Occupational History   Not on file  Tobacco Use   Smoking status: Former    Current packs/day: 0.00    Types: Cigarettes    Quit date: 06/17/2012    Years since quitting: 11.5   Smokeless tobacco: Never  Vaping Use   Vaping status: Former  Substance and Sexual Activity   Alcohol use: Yes    Comment: occ   Drug use: No   Sexual activity: Not on file  Other Topics Concern   Not on file  Social History Narrative   Not on file   Social Drivers of Health   Financial Resource Strain: Low Risk  (08/16/2023)   Overall Financial Resource Strain (CARDIA)    Difficulty of Paying Living Expenses: Not hard at all  Food Insecurity: Low  Risk  (10/12/2023)   Received from [facility]   Hunger Vital Sign    Within the past 12 months, you worried that your food would run out before you got money to buy more: Never true    Within the past 12 months, the food you bought just didn't last and you didn't have money to get more. : Never true  Transportation Needs: No Transportation Needs (10/12/2023)   Received from [facility]    In the past 12 months, has lack of reliable transportation kept you from medical appointments, meetings, work or from getting things needed for daily living? : No  Physical Activity: Sufficiently Active (08/16/2023)   Exercise Vital Sign    Days of Exercise per Week: 5 days    Minutes of Exercise per Session: 30 min  Stress: No Stress Concern Present (08/16/2023)   [employer] of Occupational Health -  Occupational Stress Questionnaire    Feeling of Stress : Not at all  Social Connections: Socially Isolated (08/16/2023)   Social Connection and Isolation Panel    Frequency of Communication with Friends and Family: More than three times a week    Frequency of Social Gatherings with Friends and Family: Three times a week    Attends Religious Services: Never    Active Member of Clubs or Organizations: No    Attends Banker Meetings: Not on file    Marital Status: Divorced  Intimate Partner Violence: Not At Risk (08/18/2022)   Humiliation, Afraid, Rape, and Kick questionnaire    Fear of Current or Ex-Partner: No    Emotionally Abused: No    Physically Abused: No    Sexually Abused: No    Family History  Problem Relation Age of Onset   Stomach cancer Mother    Alcohol abuse Mother    Alcohol abuse Father    Heart disease Father    COPD Sister    Arthritis Sister    Alcohol abuse Sister    Mental illness Sister    Heart disease Brother    Colon cancer Neg Hx    Colon polyps Neg Hx    Esophageal cancer Neg Hx    Rectal cancer Neg Hx     ROS: no fevers or chills, productive cough, hemoptysis, dysphasia, odynophagia, melena, hematochezia, dysuria, hematuria, rash, seizure activity, orthopnea, PND, pedal edema, claudication. Remaining systems are negative.  Physical Exam: Well-developed well-nourished in no acute distress.  Skin is warm and dry.  HEENT is normal.  Neck is supple.  Chest is clear to auscultation with normal expansion.  Cardiovascular exam is regular rate and rhythm.  Abdominal exam nontender or distended. No masses palpated. Extremities show no edema. neuro grossly intact   A/P  1 coronary calcification-patient doing well from a symptomatic standpoint with no chest pain.  Continue aspirin.  Intolerant to statins.  2 hyperlipidemia-patient is intolerant to statins.  Will continue present dose of Repatha  and Zetia .  Last LDL in March 2025  35.  Redell Shallow, MD

## 2023-12-19 ENCOUNTER — Other Ambulatory Visit: Payer: Self-pay | Admitting: Family

## 2023-12-28 ENCOUNTER — Ambulatory Visit: Attending: Cardiology | Admitting: Cardiology

## 2023-12-28 ENCOUNTER — Encounter: Payer: Self-pay | Admitting: Bariatrics

## 2023-12-28 ENCOUNTER — Encounter: Payer: Self-pay | Admitting: Cardiology

## 2023-12-28 ENCOUNTER — Ambulatory Visit (INDEPENDENT_AMBULATORY_CARE_PROVIDER_SITE_OTHER): Admitting: Bariatrics

## 2023-12-28 VITALS — BP 142/80 | HR 70 | Ht 65.0 in | Wt 252.0 lb

## 2023-12-28 VITALS — BP 142/88 | HR 78 | Temp 98.0°F | Ht 65.0 in | Wt 252.0 lb

## 2023-12-28 DIAGNOSIS — I2510 Atherosclerotic heart disease of native coronary artery without angina pectoris: Secondary | ICD-10-CM

## 2023-12-28 DIAGNOSIS — E7800 Pure hypercholesterolemia, unspecified: Secondary | ICD-10-CM | POA: Diagnosis not present

## 2023-12-28 DIAGNOSIS — E66813 Obesity, class 3: Secondary | ICD-10-CM

## 2023-12-28 DIAGNOSIS — E669 Obesity, unspecified: Secondary | ICD-10-CM | POA: Diagnosis not present

## 2023-12-28 DIAGNOSIS — I10 Essential (primary) hypertension: Secondary | ICD-10-CM

## 2023-12-28 DIAGNOSIS — Z6841 Body Mass Index (BMI) 40.0 and over, adult: Secondary | ICD-10-CM

## 2023-12-28 DIAGNOSIS — R7303 Prediabetes: Secondary | ICD-10-CM | POA: Diagnosis not present

## 2023-12-28 NOTE — Patient Instructions (Signed)
  Follow-Up: At [facility], you and your health needs are our priority.  As part of our continuing mission to provide you with exceptional heart care, our providers are all part of one team.  This team includes your primary Cardiologist (physician) and Advanced Practice Providers or APPs (Physician Assistants and Nurse Practitioners) who all work together to provide you with the care you need, when you need it.  Your next appointment:   12 month(s)  Provider:   Alexandria Angel MD

## 2023-12-28 NOTE — Progress Notes (Signed)
 First follow-up after initial visit.        WEIGHT SUMMARY AND BIOMETRICS  Weight Lost Since Last Visit: 5lb  Weight Gained Since Last Visit: 0   Vitals Temp: 98 F (36.7 C) BP: (!) 142/88 Pulse Rate: 78 SpO2: 98 %   Anthropometric Measurements Height: 5' 5 (1.651 m) Weight: 252 lb (114.3 kg) BMI (Calculated): 41.93 Weight at Last Visit: 257lb Weight Lost Since Last Visit: 5lb Weight Gained Since Last Visit: 0 Starting Weight: 257lb Total Weight Loss (lbs): 5 lb (2.268 kg) Peak Weight: 320lb   Body Composition  Body Fat %: 49.9 % Fat Mass (lbs): 125.8 lbs Muscle Mass (lbs): 119.8 lbs Visceral Fat Rating : 16   Other Clinical Data Fasting: yes Labs: no Today's Visit #: 2 Starting Date: 12/08/23    OBESITY Stephanie Hensley is here to discuss her progress with her obesity treatment plan along with follow-up of her obesity related diagnoses.    Nutrition Plan: the Category 4 plan and keeping a food journal with goal of 1800-2000 calories and 80 grams of protein daily - 0% adherence.  Current exercise: walking  Interim History:  Eating all of the food on the plan., Protein intake is as prescribed, Is skipping meals, and Water intake is adequate.  Initial positives regarding the dietary plan: It has been good to get back on a routine.  Initial challenges regarding  the dietary plan: Too many calories for her.   Hunger is moderately controlled.  Cravings are poorly controlled.  Assessment/Plan:   Prediabetes Last A1c was 6.1  Medication(s): none Lab Results  Component Value Date   HGBA1C 6.1 08/17/2023   HGBA1C 5.9 (H) 09/11/2022   HGBA1C 5.8 (H) 10/30/2021   HGBA1C 6.0 07/28/2021   Lab Results  Component Value Date   INSULIN  20.6 12/08/2023    Plan: Information sheet on  Insulin  Resistance and Prediabetes.  Will minimize all  refined carbohydrates both sweets and starches.  Will work on the plan and exercise.  Consider both aerobic and resistance training.  Will keep protein, water, and fiber intake high.  Increase Polyunsaturated and Monounsaturated fats to increase satiety and encourage weight loss.  Aim for 7 to 9 hours of sleep nightly.  Discussed metformin for insulin  resistance and information sheet given.  She will begin some Berberine at approximately 500 mg and can increase up to 1000 to 1200 mg(she would like a more natural alternative to traditional medication).  Dietary supplement brands sheet given.   Hypertension Hypertension control uncertain.  Medication(s): Hydrochlorothiazide  12.5 mg daily  BP Readings from Last 3 Encounters:  12/28/23 (!) 142/88  12/08/23 (!) 140/82  11/25/23 130/77   Lab Results  Component Value Date   CREATININE 1.03 08/17/2023   CREATININE 1.15 02/04/2023   CREATININE 1.21 (H) 09/11/2022   Lab Results  Component Value Date   GFR 59.08 (L) 08/17/2023   GFR 51.96 (L) 02/04/2023   GFR 55.39 (L) 07/28/2021    Plan: Continue all antihypertensives at current dosages. No added salt. Will keep sodium content to 1,500 mg or less per day.     Morbid Obesity: Current BMI BMI (Calculated): 41.93   Stephanie Hensley is currently in the action stage of change. As such, her goal is to continue with weight loss efforts.  She has agreed to keeping a food journal with goal of 1,300 calories and 80 grams of protein daily.  Exercise goals: All adults should avoid inactivity. Some physical activity is better than none, and adults who  participate in any amount of physical activity gain some health benefits.  Behavioral modification strategies: increasing lean protein intake, decreasing simple carbohydrates , no meal skipping, meal planning , increase water intake, planning for success, increasing vegetables, increasing fiber rich foods, avoiding temptations, keep healthy foods in the  home, weigh protein portions, measure portion sizes, and mindful eating.  Stephanie Hensley has agreed to follow-up with our clinic in 2 weeks.   Labs reviewed today from last visit ( insulin , vitamin D , and CBC).   Objective:   VITALS: Per patient if applicable, see vitals. GENERAL: Alert and in no acute distress. CARDIOPULMONARY: No increased WOB. Speaking in clear sentences.  PSYCH: Pleasant and cooperative. Speech normal rate and rhythm. Affect is appropriate. Insight and judgement are appropriate. Attention is focused, linear, and appropriate.  NEURO: Oriented as arrived to appointment on time with no prompting.   Attestation Statements:   This was prepared with the assistance of Engineer, civil (consulting).  Occasional wrong-word or sound-a-like substitutions may have occurred due to the inherent limitations of voice recognition software.   Clayborne Daring, DO

## 2024-01-11 ENCOUNTER — Ambulatory Visit: Admitting: Bariatrics

## 2024-01-25 ENCOUNTER — Ambulatory Visit: Admitting: Bariatrics

## 2024-02-24 ENCOUNTER — Ambulatory Visit: Admitting: Bariatrics

## 2024-02-24 ENCOUNTER — Encounter: Payer: Self-pay | Admitting: Bariatrics

## 2024-02-24 VITALS — BP 144/71 | HR 79 | Temp 97.9°F | Ht 65.0 in | Wt 247.0 lb

## 2024-02-24 DIAGNOSIS — Z6841 Body Mass Index (BMI) 40.0 and over, adult: Secondary | ICD-10-CM

## 2024-02-24 DIAGNOSIS — R7303 Prediabetes: Secondary | ICD-10-CM | POA: Diagnosis not present

## 2024-02-24 NOTE — Progress Notes (Signed)
 WEIGHT SUMMARY AND BIOMETRICS  Weight Lost Since Last Visit: 5lb  Weight Gained Since Last Visit: 0   Vitals Temp: 97.9 F (36.6 C) BP: (!) 144/71 (Can only do bp on right arm) Pulse Rate: 79 SpO2: 98 %   Anthropometric Measurements Height: 5' 5 (1.651 m) Weight: 247 lb (112 kg) BMI (Calculated): 41.1 Weight at Last Visit: 252lb Weight Lost Since Last Visit: 5lb Weight Gained Since Last Visit: 0 Starting Weight: 257lb Total Weight Loss (lbs): 10 lb (4.536 kg) Peak Weight: 320lb Waist Measurement : 55 inches   Body Composition  Body Fat %: 49.6 % Fat Mass (lbs): 122.6 lbs Muscle Mass (lbs): 118.2 lbs Visceral Fat Rating : 16   Other Clinical Data Fasting: no Labs: no Today's Visit #: 3 Starting Date: 12/08/23    OBESITY Anedra is here to discuss her progress with her obesity treatment plan along with follow-up of her obesity related diagnoses.    Nutrition Plan: the Category 4 plan and keeping a food journal with goal of 1800-2000 calories and 80 grams of protein daily - 50% adherence.  Current exercise: walking  Interim History:  She is down another 5 lbs since her last visit. She has been under a lot of stress.  Eating all of the food on the plan., Protein intake is as prescribed, and Water intake is adequate.   Pharmacotherapy: She tried the berberine but no cravings for sugar.  Hunger is moderately controlled.  Cravings are moderately controlled.  Assessment/Plan:   Prediabetes Last A1c was 6.1  Medication(s): none Lab Results  Component Value Date   HGBA1C 6.1 08/17/2023   HGBA1C 5.9 (H) 09/11/2022   HGBA1C 5.8 (H) 10/30/2021   HGBA1C 6.0 07/28/2021   Lab Results  Component Value Date   INSULIN  20.6 12/08/2023    Plan: Will minimize all refined carbohydrates both sweets and starches.  Will work on the plan and  exercise.  Consider both aerobic and resistance training.  Will keep protein, water, and fiber intake high.  Increase Polyunsaturated and Monounsaturated fats to increase satiety and encourage weight loss.  Aim for 7 to 9 hours of sleep nightly.   Handouts Choices for protein and carbohydrates.   Morbid Obesity: Current BMI BMI (Calculated): 41.1    Cybill is currently in the action stage of change. As such, her goal is to continue with weight loss efforts.  She has agreed to the Category 4 plan.  Exercise goals: All adults should avoid inactivity. Some physical activity is better than none, and adults who participate in any amount of physical activity gain some health benefits. She is going to a senior classes. She is walking her dog about 7,000 to 10,000 steps.   Behavioral modification strategies: increasing lean protein intake, decreasing simple carbohydrates , no meal skipping, increase water intake, better snacking choices, planning for success, increasing vegetables, avoiding  temptations, keep healthy foods in the home, and mindful eating.  Khalani has agreed to follow-up with our clinic in 3 weeks.      Objective:   VITALS: Per patient if applicable, see vitals. GENERAL: Alert and in no acute distress. CARDIOPULMONARY: No increased WOB. Speaking in clear sentences.  PSYCH: Pleasant and cooperative. Speech normal rate and rhythm. Affect is appropriate. Insight and judgement are appropriate. Attention is focused, linear, and appropriate.  NEURO: Oriented as arrived to appointment on time with no prompting.   Attestation Statements:   This was prepared with the assistance of Engineer, civil (consulting).  Occasional wrong-word or sound-a-like substitutions may have occurred due to the inherent limitations of voice recognition   Clayborne Daring, DO

## 2024-03-06 ENCOUNTER — Telehealth: Payer: Self-pay | Admitting: Pharmacy Technician

## 2024-03-06 NOTE — Telephone Encounter (Signed)
 SABRA

## 2024-03-20 ENCOUNTER — Other Ambulatory Visit: Payer: Self-pay

## 2024-03-20 DIAGNOSIS — Z79899 Other long term (current) drug therapy: Secondary | ICD-10-CM

## 2024-03-21 ENCOUNTER — Other Ambulatory Visit: Payer: Self-pay | Admitting: Cardiology

## 2024-03-21 DIAGNOSIS — Z79899 Other long term (current) drug therapy: Secondary | ICD-10-CM

## 2024-03-22 ENCOUNTER — Other Ambulatory Visit (HOSPITAL_COMMUNITY): Payer: Self-pay

## 2024-03-22 ENCOUNTER — Telehealth: Payer: Self-pay | Admitting: Cardiology

## 2024-03-22 DIAGNOSIS — E7800 Pure hypercholesterolemia, unspecified: Secondary | ICD-10-CM

## 2024-03-22 DIAGNOSIS — Z79899 Other long term (current) drug therapy: Secondary | ICD-10-CM

## 2024-03-22 DIAGNOSIS — I2510 Atherosclerotic heart disease of native coronary artery without angina pectoris: Secondary | ICD-10-CM

## 2024-03-22 MED ORDER — EZETIMIBE 10 MG PO TABS: 10.0000 mg | ORAL_TABLET | Freq: Every day | ORAL | 3 refills | Status: AC

## 2024-03-22 MED ORDER — EZETIMIBE 10 MG PO TABS
10.0000 mg | ORAL_TABLET | Freq: Every day | ORAL | 3 refills | Status: DC
Start: 2024-03-22 — End: 2024-03-22
  Filled 2024-03-22: qty 90, 90d supply, fill #0

## 2024-03-22 MED ORDER — REPATHA SURECLICK 140 MG/ML ~~LOC~~ SOAJ: 140.0000 mg | SUBCUTANEOUS | 1 refills | Status: DC

## 2024-03-22 MED ORDER — REPATHA SURECLICK 140 MG/ML ~~LOC~~ SOAJ
140.0000 mg | SUBCUTANEOUS | 1 refills | Status: DC
  Filled 2024-03-22: qty 6, 84d supply, fill #0

## 2024-03-22 NOTE — Telephone Encounter (Signed)
Sent medications to [facility] 

## 2024-03-22 NOTE — Telephone Encounter (Signed)
*  STAT* If patient is at the pharmacy, call can be transferred to refill team.   1. Which medications need to be refilled? (please list name of each medication and dose if known)   ezetimibe  (ZETIA ) 10 MG tablet  REPATHA  SURECLICK 140 MG/ML SOAJ   2. Would you like to learn more about the convenience, safety, & potential cost savings by using the [facility]?   3. Are you open to using the [facility] (Type [facility]. ).  4. Which pharmacy/location (including street and city if local pharmacy) is medication to be sent to?  [id number] - [place], [place] - [address] AT [facility] [address]   5. Do they need a 30 day or 90 day supply?   90 day  Patient stated she still has medication left.

## 2024-03-23 ENCOUNTER — Ambulatory Visit: Admitting: Bariatrics

## 2024-03-30 DIAGNOSIS — M19012 Primary osteoarthritis, left shoulder: Secondary | ICD-10-CM | POA: Diagnosis not present

## 2024-03-30 DIAGNOSIS — Z96612 Presence of left artificial shoulder joint: Secondary | ICD-10-CM | POA: Diagnosis not present

## 2024-04-06 DIAGNOSIS — M19012 Primary osteoarthritis, left shoulder: Secondary | ICD-10-CM | POA: Diagnosis not present

## 2024-04-17 ENCOUNTER — Ambulatory Visit: Admitting: Bariatrics

## 2024-04-18 ENCOUNTER — Other Ambulatory Visit: Payer: Self-pay

## 2024-04-18 ENCOUNTER — Emergency Department (HOSPITAL_BASED_OUTPATIENT_CLINIC_OR_DEPARTMENT_OTHER)

## 2024-04-18 ENCOUNTER — Emergency Department (HOSPITAL_BASED_OUTPATIENT_CLINIC_OR_DEPARTMENT_OTHER)
Admission: EM | Admit: 2024-04-18 | Discharge: 2024-04-18 | Disposition: A | Discharge status: Home / Self Care | Source: Ambulatory Visit | Attending: Emergency Medicine | Admitting: Emergency Medicine

## 2024-04-18 ENCOUNTER — Encounter (HOSPITAL_BASED_OUTPATIENT_CLINIC_OR_DEPARTMENT_OTHER): Payer: Self-pay

## 2024-04-18 DIAGNOSIS — M4187 Other forms of scoliosis, lumbosacral region: Secondary | ICD-10-CM | POA: Diagnosis not present

## 2024-04-18 DIAGNOSIS — S0990XA Unspecified injury of head, initial encounter: Secondary | ICD-10-CM | POA: Diagnosis not present

## 2024-04-18 DIAGNOSIS — M47812 Spondylosis without myelopathy or radiculopathy, cervical region: Secondary | ICD-10-CM | POA: Diagnosis not present

## 2024-04-18 DIAGNOSIS — Z87891 Personal history of nicotine dependence: Secondary | ICD-10-CM | POA: Diagnosis not present

## 2024-04-18 DIAGNOSIS — W19XXXA Unspecified fall, initial encounter: Secondary | ICD-10-CM

## 2024-04-18 DIAGNOSIS — Y92481 Parking lot as the place of occurrence of the external cause: Secondary | ICD-10-CM | POA: Diagnosis not present

## 2024-04-18 DIAGNOSIS — W010XXA Fall on same level from slipping, tripping and stumbling without subsequent striking against object, initial encounter: Secondary | ICD-10-CM | POA: Insufficient documentation

## 2024-04-18 DIAGNOSIS — S0003XA Contusion of scalp, initial encounter: Secondary | ICD-10-CM | POA: Diagnosis not present

## 2024-04-18 DIAGNOSIS — S0081XA Abrasion of other part of head, initial encounter: Secondary | ICD-10-CM | POA: Diagnosis not present

## 2024-04-18 DIAGNOSIS — I6523 Occlusion and stenosis of bilateral carotid arteries: Secondary | ICD-10-CM | POA: Diagnosis not present

## 2024-04-18 DIAGNOSIS — M4802 Spinal stenosis, cervical region: Secondary | ICD-10-CM | POA: Diagnosis not present

## 2024-04-18 NOTE — ED Triage Notes (Signed)
 Patient reports fall face first. Large bump noted. -LOC and no anticoagulation.

## 2024-04-18 NOTE — ED Provider Notes (Signed)
 [place] EMERGENCY DEPARTMENT AT [facility] Provider Note   CSN: [id number] Arrival date & time: 04/18/24  1209     Patient presents with: Fall and Head Injury   Stephanie Hensley is a 63 y.o. female.   63 year old female presenting after a fall.  Patient was walking in her apartment complex parking lot when she did not lift her foot enough and tripped over the curb, resulting in a fall with the majority of the impact occurring to her face.  She is not on blood thinners and denies loss of consciousness, no vision changes or dizziness.  She has been icing her left forehead due to swelling, she reports pain in her face primarily of the left forehead and along the left/lateral aspect of her nose.   Fall  Head Injury      Prior to Admission medications   Medication Sig Start Date End Date Taking? Authorizing Provider  acetaminophen  (TYLENOL ) 500 MG tablet Take 500 mg by mouth every 6 (six) hours as needed.    [provider]  albuterol  (VENTOLIN  HFA) 108 (90 Base) MCG/ACT inhaler Inhale 1-2 puffs into the lungs every 6 (six) hours as needed for wheezing or shortness of breath. 09/16/23   Jason Leita Repine, FNP  Calcium -Magnesium-Vitamin D  (CITRACAL SLOW RELEASE PO) Take by mouth.    [provider]  cetirizine (ZYRTEC) 10 MG tablet Take 10 mg by mouth daily.    [provider]  Evolocumab  (REPATHA  SURECLICK) 140 MG/ML SOAJ Inject 140 mg into the skin every 14 (fourteen) days. 03/22/24   Pietro Redell RAMAN, MD  ezetimibe  (ZETIA ) 10 MG tablet Take 1 tablet (10 mg total) by mouth daily. 03/22/24   Pietro Redell RAMAN, MD  Ferrous Sulfate (SLOW RELEASE IRON PO) Take by mouth. Twice a week    [provider]  hydrochlorothiazide  (MICROZIDE ) 12.5 MG capsule Take 1 capsule (12.5 mg total) by mouth daily. 09/16/23   Jason Leita Repine, FNP  Magnesium 250 MG TABS Take by mouth.    [provider]  Multiple Vitamins-Minerals (ALIVE WOMENS 50+)  CHEW Chew by mouth.    [provider]    Allergies: Crestor  [rosuvastatin ] and Lipitor [atorvastatin ]    Review of Systems  Updated Vital Signs  Vitals:   04/18/24 1225 04/18/24 1235 04/18/24 1240 04/18/24 1255  BP: (!) 191/76 (!) 157/84    Pulse: 78 84 74 73  Resp: 19 18    Temp: 98.1 F (36.7 C)     SpO2: 96% 97% 98% 100%     Physical Exam Vitals and nursing note reviewed.  HENT:     Head: Normocephalic.   Eyes:     Extraocular Movements: Extraocular movements intact.     Pupils: Pupils are equal, round, and reactive to light.  Cardiovascular:     Rate and Rhythm: Normal rate.  Pulmonary:     Effort: Pulmonary effort is normal.  Musculoskeletal:     Cervical back: Normal range of motion and neck supple. No tenderness.     Comments: Moves all extremities spontaneously without difficulty Back: No midline spinous process tenderness to palpation  Skin:    General: Skin is warm and dry.     Comments: L forehead with swelling and superficial abrasions, not actively bleeding, see photo  Neurological:     General: No focal deficit present.     Mental Status: She is alert and oriented to person, place, and time.     (all labs ordered are listed, but  only abnormal results are displayed) Labs Reviewed - No data to display  EKG: None  Radiology: CT Head Wo Contrast Result Date: 04/18/2024 CLINICAL DATA:  Fall with head trauma. EXAM: CT HEAD WITHOUT CONTRAST CT MAXILLOFACIAL WITHOUT CONTRAST TECHNIQUE: Multidetector CT imaging of the head and maxillofacial structures were performed using the standard protocol without intravenous contrast. Multiplanar CT image reconstructions of the maxillofacial structures were also generated. RADIATION DOSE REDUCTION: This exam was performed according to the departmental dose-optimization program which includes automated exposure control, adjustment of the mA and/or kV according to patient size and/or use of iterative  reconstruction technique. COMPARISON:  None Available. FINDINGS: CT HEAD FINDINGS Brain: Ventricles, cisterns and other CSF spaces are normal. There is no mass, mass effect, shift of midline structures or acute hemorrhage. No evidence of acute infarction. Vascular: No hyperdense vessel or unexpected calcification. Skull: Normal. Negative for fracture or focal lesion. Sinuses/Orbits: Orbits are normal. Hypoplastic frontal sinuses. Remaining paranasal sinuses are clear. Mastoid air cells are clear. Other: Midline to left-sided frontal scalp contusion. CT MAXILLOFACIAL FINDINGS Osseous: Possible very subtle nondisplaced nasal bone fracture. Otherwise, no evidence of acute facial bone fractures. Orbits: Negative. No traumatic or inflammatory finding. Sinuses: Paranasal sinuses are clear. Note that there are hypoplastic frontal sinuses. Mastoid air cells are clear. Minimal deviation of the nasal septum to the right. Soft tissues: Soft tissue swelling over the midline to left frontal scalp. No other areas of soft tissue injury. IMPRESSION: 1. No acute intracranial abnormality. 2. Possible subtle nondisplaced nasal bone fracture. 3. Scalp contusion over the midline to left frontal region. No acute findings. Electronically Signed   By: Toribio Agreste M.D.   On: 04/18/2024 13:36   CT Maxillofacial Wo Contrast Result Date: 04/18/2024 CLINICAL DATA:  Fall with head trauma. EXAM: CT HEAD WITHOUT CONTRAST CT MAXILLOFACIAL WITHOUT CONTRAST TECHNIQUE: Multidetector CT imaging of the head and maxillofacial structures were performed using the standard protocol without intravenous contrast. Multiplanar CT image reconstructions of the maxillofacial structures were also generated. RADIATION DOSE REDUCTION: This exam was performed according to the departmental dose-optimization program which includes automated exposure control, adjustment of the mA and/or kV according to patient size and/or use of iterative reconstruction technique.  COMPARISON:  None Available. FINDINGS: CT HEAD FINDINGS Brain: Ventricles, cisterns and other CSF spaces are normal. There is no mass, mass effect, shift of midline structures or acute hemorrhage. No evidence of acute infarction. Vascular: No hyperdense vessel or unexpected calcification. Skull: Normal. Negative for fracture or focal lesion. Sinuses/Orbits: Orbits are normal. Hypoplastic frontal sinuses. Remaining paranasal sinuses are clear. Mastoid air cells are clear. Other: Midline to left-sided frontal scalp contusion. CT MAXILLOFACIAL FINDINGS Osseous: Possible very subtle nondisplaced nasal bone fracture. Otherwise, no evidence of acute facial bone fractures. Orbits: Negative. No traumatic or inflammatory finding. Sinuses: Paranasal sinuses are clear. Note that there are hypoplastic frontal sinuses. Mastoid air cells are clear. Minimal deviation of the nasal septum to the right. Soft tissues: Soft tissue swelling over the midline to left frontal scalp. No other areas of soft tissue injury. IMPRESSION: 1. No acute intracranial abnormality. 2. Possible subtle nondisplaced nasal bone fracture. 3. Scalp contusion over the midline to left frontal region. No acute findings. Electronically Signed   By: Toribio Agreste M.D.   On: 04/18/2024 13:36   CT Cervical Spine Wo Contrast Result Date: 04/18/2024 CLINICAL DATA:  Status post fall.  Facial contusion. EXAM: CT CERVICAL SPINE WITHOUT CONTRAST TECHNIQUE: Multidetector CT imaging of the cervical spine was performed  without intravenous contrast. Multiplanar CT image reconstructions were also generated. RADIATION DOSE REDUCTION: This exam was performed according to the departmental dose-optimization program which includes automated exposure control, adjustment of the mA and/or kV according to patient size and/or use of iterative reconstruction technique. COMPARISON:  None Available. FINDINGS: Alignment: Convex left thoracolumbar scoliosis. No focal angulation or  listhesis. Skull base and vertebrae: No evidence of acute cervical spine fracture or traumatic subluxation. Soft tissues and spinal canal: No prevertebral fluid or swelling. No visible canal hematoma. Disc levels: Multilevel spondylosis with disc space narrowing and uncinate spurring most advanced at C5-6. Scattered facet degenerative changes. No large disc herniation or high-grade spinal stenosis demonstrated. Up to moderate right foraminal narrowing at C5-6. Upper chest: Clear lung apices. Other: Bilateral carotid atherosclerosis. IMPRESSION: 1. No evidence of acute cervical spine fracture, traumatic subluxation or static signs of instability. 2. Multilevel cervical spondylosis as described. Electronically Signed   By: Elsie Perone M.D.   On: 04/18/2024 12:59     Procedures   Medications Ordered in the ED - No data to display                                  Medical Decision Making This patient presents to the ED for concern of fall, this involves an extensive number of treatment options, and is a complaint that carries with it a high risk of complications and morbidity.  The differential diagnosis includes fracture, contusion, concussion, intracranial hemorrhage    Co morbidities that complicate the patient evaluation  Chronic shoulder pain   Additional history obtained:  Additional history obtained from record review External records from outside source obtained and reviewed including prior PCP note  Imaging Studies ordered:  I ordered imaging studies including CT head, CT maxillofacial, CT C-spine  I independently visualized and interpreted imaging which showed  - C-spine:1. No evidence of acute cervical spine fracture, traumatic subluxation or static signs of instability. 2. Multilevel cervical spondylosis as described. - Head/Maxillofacial:1. No acute intracranial abnormality. 2. Possible subtle nondisplaced nasal bone fracture. 3. Scalp contusion over the midline to left  frontal region.  I agree with the radiologist interpretation   Cardiac Monitoring: / EKG:  The patient was maintained on a cardiac monitor.  I personally viewed and interpreted the cardiac monitored which showed an underlying rhythm of: NSR   Problem List / ED Course / Critical interventions / Medication management I have reviewed the patients home medicines and have made adjustments as needed   Social Determinants of Health:  Former tobacco use   Test / Admission - Considered:  Physical exam is remarkable as above, patient does have some tenderness to palpation to the left/lateral aspect of her nose as well as her left forehead with associated swelling and superficial abrasions.  Abrasions are not actively bleeding and do not require repair due to their superficial nature.  CT imaging is largely reassuring as above, possible subtle nondisplaced nasal bone fracture, no intervention necessary.  At time my reassessment patient has been icing her forehead and notes improvement in her pain and swelling, she denies dizziness or vision changes, she is able to answer my questions appropriately and is not demonstrating any signs of confusion.  I recommend that she follow-up with her PCP, continue Tylenol /ibuprofen  as needed for pain as well as ice for swelling, she may use a topical antibiotic like bacitracin for her abrasions.  She voiced understanding is  in agreement this plan, return precautions discussed, she is appropriate for discharge at this time.    Amount and/or Complexity of Data Reviewed Radiology: ordered.        Final diagnoses:  Fall, initial encounter    ED Discharge Orders     None          Glendia Rocky SAILOR, [place] 04/18/24 1356    Jerrol Agent, MD 04/18/24 [phone]

## 2024-04-18 NOTE — Discharge Instructions (Signed)
 Your CT scan shows a possible subtle nondisplaced fracture of your nasal bone, this does not require any intervention.  Continue Tylenol /ibuprofen  as needed for pain, continue to ice your face to reduce swelling.  You may apply a topical antibiotic ointment, like bacitracin, to promote healing.  Follow-up with your PCP.  Return to the emergency department if your symptoms worsen.

## 2024-04-20 ENCOUNTER — Ambulatory Visit: Admitting: Nurse Practitioner

## 2024-04-27 ENCOUNTER — Ambulatory Visit: Admitting: Nurse Practitioner

## 2024-04-27 ENCOUNTER — Encounter: Payer: Self-pay | Admitting: Nurse Practitioner

## 2024-04-27 VITALS — BP 136/84 | HR 83 | Temp 98.2°F | Ht 65.0 in | Wt 241.0 lb

## 2024-04-27 DIAGNOSIS — R7303 Prediabetes: Secondary | ICD-10-CM

## 2024-04-27 DIAGNOSIS — Z6841 Body Mass Index (BMI) 40.0 and over, adult: Secondary | ICD-10-CM

## 2024-04-27 DIAGNOSIS — E66813 Obesity, class 3: Secondary | ICD-10-CM

## 2024-04-27 NOTE — Progress Notes (Signed)
 Office: [phone]  /  Fax: [phone]  WEIGHT SUMMARY AND BIOMETRICS  Weight Lost Since Last Visit: 6lb  Weight Gained Since Last Visit: 0lb   Vitals Temp: 98.2 F (36.8 C) BP: 136/84 Pulse Rate: 83 SpO2: 96 %   Anthropometric Measurements Height: 5' 5 (1.651 m) Weight: 241 lb (109.3 kg) BMI (Calculated): 40.1 Weight at Last Visit: 247lb Weight Lost Since Last Visit: 6lb Weight Gained Since Last Visit: 0lb Starting Weight: 257lb Total Weight Loss (lbs): 16 lb (7.258 kg)   Body Composition  Body Fat %: 48.7 % Fat Mass (lbs): 117.6 lbs Muscle Mass (lbs): 117.8 lbs Visceral Fat Rating : 16   Other Clinical Data Fasting: No Labs: No Today's Visit #: 4 Starting Date: 12/08/23     HPI  Chief Complaint: OBESITY  Stephanie Hensley is here to discuss Stephanie Hensley progress with Stephanie Hensley obesity treatment plan. Stephanie Hensley is on the the Category 4 Plan and keeping a food journal and adhering to recommended goals of 1800-2000 calories and 80 protein and states Stephanie Hensley is following Stephanie Hensley eating plan approximately 0 % of the time. Stephanie Hensley states Stephanie Hensley is exercising 0 minutes 0 days per week.   Interval History:  Since last office visit Stephanie Hensley has lost 6 pounds.  Stephanie Hensley thinks Stephanie Hensley is averaging around 1500 calories and 80 grams of protein. Stephanie Hensley is not using an app to track-Stephanie Hensley is watching Stephanie Hensley calories. Stephanie Hensley is making healthier choices and watching Stephanie Hensley portion sizes.  Stephanie Hensley is drinking water and diet soda daily.  Stephanie Hensley also drinks coffee 1-2 days per week.  Stephanie Hensley is walking to stay active-7,000-9,000 steps per day.  Stephanie Hensley is schedule for left shoulder surgery 06/30/24. Stephanie Hensley fell on 04/18/24 and is overall recovering well.  Stephanie Hensley is considering seeing/wanting a referral to see a therapist.    Stephanie Hensley highest weight was 315 lbs.    Pharmacotherapy for weight loss: Stephanie Hensley is not currently taking medications  for medical weight loss.   Previous pharmacotherapy for medical weight loss:  compounding Segmulatide-stopped in  October  Bariatric surgery:  Patient has not had bariatric surgery  Insulin  Resistance/pre diabetes Last fasting insulin  was 20.6. A1c was 5.8 on 10/19/23.  Stephanie Hensley is checking Stephanie Hensley fast BS once per week-90-100. Polyphagia:Yes Medication(s): none-Stephanie Hensley is not currently intersted in starting Metformin at this time.   Lab Results  Component Value Date   HGBA1C 6.1 08/17/2023   HGBA1C 5.9 (H) 09/11/2022   HGBA1C 5.8 (H) 10/30/2021   HGBA1C 6.0 07/28/2021   Lab Results  Component Value Date   INSULIN  20.6 12/08/2023   PHYSICAL EXAM:  Blood pressure 136/84, pulse 83, temperature 98.2 F (36.8 C), height 5' 5 (1.651 m), weight 241 lb (109.3 kg), last menstrual period 12/24/2010, SpO2 96%. Body mass index is 40.1 kg/m.  General: Stephanie Hensley is overweight, cooperative, alert, well developed, and in no acute distress. PSYCH: Has normal mood, affect and thought process.   Extremities: No edema.  Neurologic: No gross sensory or motor deficits. No tremors or fasciculations noted.    DIAGNOSTIC DATA REVIEWED:  BMET    Component Value Date/Time   NA 138 08/17/2023 1514   K 4.2 08/17/2023 1514   CL 104 08/17/2023 1514   CO2 24 08/17/2023 1514   GLUCOSE 92 08/17/2023 1514   BUN 22 08/17/2023 1514   CREATININE 1.03 08/17/2023 1514   CREATININE 1.21 (H) 09/11/2022 1411   CALCIUM  10.1 08/17/2023 1514   GFRNONAA >60 11/22/2018 1118   GFRAA >60 11/22/2018 1118   Lab Results  Component Value Date   HGBA1C 6.1 08/17/2023   HGBA1C 6.0 07/28/2021   Lab Results  Component Value Date   INSULIN  20.6 12/08/2023   No results found for: TSH CBC    Component Value Date/Time   WBC 11.1 (H) 12/08/2023 0946   WBC 7.8 08/31/2023 1143   RBC 4.53 12/08/2023 0946   RBC 4.37 08/31/2023 1143   HGB 13.1 12/08/2023 0946   HCT 41.2 12/08/2023 0946   PLT 263 12/08/2023 0946   MCV 91 12/08/2023 0946   MCH 28.9 12/08/2023 0946   MCH 29.6 09/11/2022 1411   MCHC 31.8 12/08/2023 0946   MCHC 32.3 08/31/2023  1143   RDW 12.3 12/08/2023 0946   Iron Studies No results found for: IRON, TIBC, FERRITIN, IRONPCTSAT Lipid Panel     Component Value Date/Time   CHOL 123 08/17/2023 1514   CHOL 127 02/04/2023 0847   TRIG 149.0 08/17/2023 1514   HDL 57.90 08/17/2023 1514   HDL 56 02/04/2023 0847   CHOLHDL 2 08/17/2023 1514   VLDL 29.8 08/17/2023 1514   LDLCALC 35 08/17/2023 1514   LDLCALC 50 02/04/2023 0847   Hepatic Function Panel     Component Value Date/Time   PROT 7.5 08/17/2023 1514   PROT 7.1 02/04/2023 0847   ALBUMIN 4.4 08/17/2023 1514   ALBUMIN 4.4 02/04/2023 0847   AST 26 08/17/2023 1514   ALT 10 08/17/2023 1514   ALKPHOS 100 08/17/2023 1514   BILITOT 0.4 08/17/2023 1514   BILITOT 0.3 02/04/2023 0847   BILIDIR 0.11 02/04/2023 0847   No results found for: TSH Nutritional Lab Results  Component Value Date   VD25OH 58.7 12/08/2023     ASSESSMENT AND PLAN  TREATMENT PLAN FOR OBESITY:  Recommended Dietary Goals  Stephanie Hensley is currently in the action stage of change. As such, Stephanie Hensley goal is to continue weight management plan. Stephanie Hensley has agreed to practicing portion control and making smarter food choices, such as increasing vegetables and decreasing simple carbohydrates.  Behavioral Intervention  We discussed the following Behavioral Modification Strategies today: increasing lean protein intake to established goals, decreasing simple carbohydrates , increasing vegetables, increasing fiber rich foods, increasing water intake , work on meal planning and preparation, reading food labels , avoiding temptations and identifying enticing environmental cues, continue to work on implementation of reduced calorie nutritional plan, continue to practice mindfulness when eating, continue to work on maintaining a reduced calorie state, getting the recommended amount of protein, incorporating whole foods, making healthy choices, staying well hydrated and practicing mindfulness when eating.,  and increase protein intake, fibrous foods (25 grams per day for women, 30 grams for men) and water to improve satiety and decrease hunger signals. .  Additional resources provided today: NA  Recommended Physical Activity Goals  Stephanie Hensley has been advised to work up to 150 minutes of moderate intensity aerobic activity a week and strengthening exercises 2-3 times per week for cardiovascular health, weight loss maintenance and preservation of muscle mass.   Stephanie Hensley has agreed to Think about enjoyable ways to increase daily physical activity and overcoming barriers to exercise, Increase physical activity in their day and reduce sedentary time (increase NEAT)., Continue to gradually increase the amount and intensity of exercise routine, Increase volume of physical activity to a goal of 240 minutes a week, and Combine aerobic and strengthening exercises for efficiency and improved cardiometabolic health.   Pharmacotherapy Stephanie Hensley is not interested in starting a weight loss medication  ASSOCIATED CONDITIONS ADDRESSED TODAY  Action/Plan  Prediabetes Stephanie Hensley will continue to work on weight loss, exercise, and decreasing simple carbohydrates to help decrease the risk of diabetes.    Patient is not currently interested in starting Metformin at this time.  Stephanie Hensley is planning to schedule an appt with Stephanie Hensley PCP for follow up and labs.  States Stephanie Hensley doesn't want to obtain labs here due to cost.   Class 3 severe obesity due to excess calories with serious comorbidity and body mass index (BMI) of 40.0 to 44.9 in adult Stephanie Hensley)      Stephanie Hensley is to sched an appt with Stephanie Hensley PCP for follow up and labs.   Patient would benefit from seeing a therapist for follow up.  Plans to contact Stephanie Hensley insurance to discuss cost.  Will let me know if Stephanie Hensley would like a referral.     Return in about 4 weeks (around 05/25/2024).Stephanie Hensley Stephanie Hensley was informed of the importance of frequent follow up visits to maximize Stephanie Hensley success with intensive lifestyle  modifications for Stephanie Hensley multiple health conditions.   ATTESTASTION STATEMENTS:  Reviewed by clinician on day of visit: allergies, medications, problem list, medical history, surgical history, family history, social history, and previous encounter notes.   I personally spent a total of 42 minutes in the care of the patient today including preparing to see the patient, getting/reviewing separately obtained history, performing a medically appropriate exam/evaluation, counseling and educating, and documenting clinical information in the EHR.    Stephanie Hensley. Zyrion Coey FNP-C

## 2024-04-28 DIAGNOSIS — I129 Hypertensive chronic kidney disease with stage 1 through stage 4 chronic kidney disease, or unspecified chronic kidney disease: Secondary | ICD-10-CM | POA: Diagnosis not present

## 2024-04-28 DIAGNOSIS — I10 Essential (primary) hypertension: Secondary | ICD-10-CM | POA: Diagnosis not present

## 2024-04-28 DIAGNOSIS — R7303 Prediabetes: Secondary | ICD-10-CM | POA: Diagnosis not present

## 2024-04-28 DIAGNOSIS — Z13220 Encounter for screening for lipoid disorders: Secondary | ICD-10-CM | POA: Diagnosis not present

## 2024-04-28 DIAGNOSIS — R634 Abnormal weight loss: Secondary | ICD-10-CM | POA: Diagnosis not present

## 2024-04-28 DIAGNOSIS — N1832 Chronic kidney disease, stage 3b: Secondary | ICD-10-CM | POA: Diagnosis not present

## 2024-04-29 ENCOUNTER — Encounter: Payer: Self-pay | Admitting: Nurse Practitioner

## 2024-05-25 ENCOUNTER — Ambulatory Visit: Admitting: Nurse Practitioner

## 2024-06-14 ENCOUNTER — Telehealth: Payer: Self-pay | Admitting: Cardiology

## 2024-06-14 NOTE — Telephone Encounter (Signed)
" °*  STAT* If patient is at the pharmacy, call can be transferred to refill team.   1. Which medications need to be refilled? (please list name of each medication and dose if known)   Evolocumab  (REPATHA  SURECLICK) 140 MG/ML SOAJ   2. Would you like to learn more about the convenience, safety, & potential cost savings by using the [facility]?   3. Are you open to using the [facility] (Type [facility]  4. Which pharmacy/location (including street and city if local pharmacy) is medication to be sent to?  [id number] - [place], [place] - [address] AT [facility] [address]   5. Do they need a 30 day or 90 day supply?   Patient stated she still has medication left.  "

## 2024-06-16 ENCOUNTER — Other Ambulatory Visit: Payer: Self-pay

## 2024-06-16 DIAGNOSIS — I2510 Atherosclerotic heart disease of native coronary artery without angina pectoris: Secondary | ICD-10-CM

## 2024-06-16 DIAGNOSIS — E7800 Pure hypercholesterolemia, unspecified: Secondary | ICD-10-CM

## 2024-06-16 MED ORDER — REPATHA SURECLICK 140 MG/ML ~~LOC~~ SOAJ: 140.0000 mg | SUBCUTANEOUS | 1 refills | Status: AC

## 2024-06-16 NOTE — Telephone Encounter (Signed)
 RX sent in.

## 2024-06-21 NOTE — Progress Notes (Addendum)
 PCP - Dr. Richerd Ship, MD Clearance 04-17-24 media  Cardiologist - Dr. Redell Shallow  LOV 12-28-23 epic    PPM/ICD -  Device Orders -  Rep Notified -   Chest x-ray -  EKG - 12-08-23 epic Stress Test -  ECHO -  Cardiac Cath -  CT cardiac- 09-12-21 epic  Sleep Study - n/a CPAP - n/a  Fasting Blood Sugar -  Checks Blood Sugar ___one x a week__   Blood Thinner Instructions:n/a Aspirin Instructions:n/a  ERAS Protcol - PRE-SURGERY  G2-    COVID vaccine -no  Activity--Able to climb a flight of stairs with no CP or SOB, Walks dog  Anesthesia review: CT cardiac score was high, pre-DM PTSD, Asthma, WBC 16.9  Patient denies shortness of breath, fever, cough and chest pain at PAT appointment   All instructions explained to the patient, with a verbal understanding of the material. Patient agrees to go over the instructions while at home for a better understanding. Patient also instructed to self quarantine after being tested for COVID-19. The opportunity to ask questions was provided.

## 2024-06-21 NOTE — Patient Instructions (Addendum)
 SURGICAL WAITING ROOM VISITATION  Patients having surgery or a procedure may have no more than 2 support people in the waiting area - these visitors may rotate.    Children ages 20 and under will not be able to visit patients in [facility] under most circumstances.   Visitors with respiratory illnesses are discouraged from visiting and should remain at home.  If the patient needs to stay at the hospital during part of their recovery, the visitor guidelines for inpatient rooms apply. Pre-op nurse will coordinate an appropriate time for 1 support person to accompany patient in pre-op.  This support person may not rotate.    Please refer to the [facility] website for the visitor guidelines for Inpatients (after your surgery is over and you are in a regular room).       Your procedure is scheduled on: 06-30-24    Report to [facility] Main Entrance    Report to admitting at       10:20  AM   Call this number if you have problems the morning of surgery [phone]   Do not eat food :After Midnight.   After Midnight you may have the following liquids until _0940_____ AM/ DAY OF SURGERY  Water Non-Citrus Juices (without pulp, NO RED-Apple, White grape, White cranberry) Black Coffee (NO MILK/CREAM OR CREAMERS, sugar ok)  Clear Tea (NO MILK/CREAM OR CREAMERS, sugar ok) regular and decaf                             Plain Jell-O (NO RED)                                           Fruit ices (not with fruit pulp, NO RED)                                     Popsicles (NO RED)                                                               Sports drinks like Gatorade (NO RED)              .        The day of surgery:  Drink ONE (1) Pre-Surgery  G2 0940 AM the morning of surgery. Drink in one sitting. Do not sip.  This drink was given to you during your hospital  pre-op appointment visit. Nothing else to drink after completing the  Pre-Surgery  G2.          If you  have questions, please contact your surgeons office.   FOLLOW  ANY ADDITIONAL PRE OP INSTRUCTIONS YOU RECEIVED FROM YOUR SURGEON'S OFFICE!!!     Oral Hygiene is also important to reduce your risk of infection.                                    Remember - BRUSH YOUR TEETH THE MORNING OF SURGERY WITH YOUR REGULAR  TOOTHPASTE  DENTURES WILL BE REMOVED PRIOR TO SURGERY PLEASE DO NOT APPLY Poly grip OR ADHESIVES!!!   Do NOT smoke after Midnight   Stop all vitamins and herbal supplements 7 days before surgery.   Take these medicines the morning of surgery with A SIP OF WATER: tramadol ,  zetia , zyrtec, bring rescue inhaler    Bring CPAP mask and tubing day of surgery.                              You may not have any metal on your body including hair pins, jewelry, and body piercing             Do not wear make-up, lotions, powders, perfumes/cologne, or deodorant  Do not wear nail polish including gel and S&S, artificial/acrylic nails, or any other type of covering on natural nails including finger and toenails. If you have artificial nails, gel coating, etc. that needs to be removed by a nail salon please have this removed prior to surgery or surgery may need to be canceled/ delayed if the surgeon/ anesthesia feels like they are unable to be safely monitored.   Do not shave  4 days prior to surgery.          .   Do not bring valuables to the hospital. [place] IS NOT             RESPONSIBLE   FOR VALUABLES.   Contacts, glasses, dentures or bridgework may not be worn into surgery.   Bring small overnight bag day of surgery.   DO NOT BRING YOUR HOME MEDICATIONS TO THE HOSPITAL. PHARMACY WILL DISPENSE MEDICATIONS LISTED ON YOUR MEDICATION LIST TO YOU DURING YOUR ADMISSION IN THE HOSPITAL!    Patients discharged on the day of surgery will not be allowed to drive home.  Someone NEEDS to stay with you for the first 24 hours after anesthesia.   Special Instructions: Bring a copy  of your healthcare power of attorney and living will documents the day of surgery if you haven't scanned them before.              Please read over the following fact sheets you were given: IF YOU HAVE QUESTIONS ABOUT YOUR PRE-OP INSTRUCTIONS PLEASE CALL [phone].    If you test positive for Covid or have been in contact with anyone that has tested positive in the last 10 days please notify you surgeon.      Pre-operative 4 CHG Bath Instructions  DYNA-Hex 4 Chlorhexidine  Gluconate 4% Solution Antiseptic 4 fl. oz   You can play a key role in reducing the risk of infection after surgery. Your skin needs to be as free of germs as possible. You can reduce the number of germs on your skin by washing with CHG (chlorhexidine  gluconate) soap before surgery. CHG is an antiseptic soap that kills germs and continues to kill germs even after washing.   DO NOT use if you have an allergy to chlorhexidine /CHG or antibacterial soaps. If your skin becomes reddened or irritated, stop using the CHG and notify one of our RNs at   Please shower with the CHG soap starting 4 days before surgery using the following schedule:     Please keep in mind the following:  DO NOT shave, including legs and underarms, starting the day of your first shower.   You may shave your face at any point before/day of surgery.  Place  clean sheets on your bed the day you start using CHG soap. Use a clean washcloth (not used since being washed) for each shower. DO NOT sleep with pets once you start using the CHG.  CHG Shower Instructions:  If you choose to wash your hair and private area, wash first with your normal shampoo/soap.  After you use shampoo/soap, rinse your hair and body thoroughly to remove shampoo/soap residue.  Turn the water OFF and apply about 3 tablespoons (45 ml) of CHG soap to a CLEAN washcloth.  Apply CHG soap ONLY FROM YOUR NECK DOWN TO YOUR TOES (washing for 3-5 minutes)  DO NOT use CHG soap on face, private  areas, open wounds, or sores.  Pay special attention to the area where your surgery is being performed.  If you are having back surgery, having someone wash your back for you may be helpful. Wait 2 minutes after CHG soap is applied, then you may rinse off the CHG soap.  Pat dry with a clean towel  Put on clean clothes/pajamas   If you choose to wear lotion, please use ONLY the CHG-compatible lotions on the back of this paper.     Additional instructions for the day of surgery: DO NOT APPLY any lotions, deodorants, cologne, or perfumes.   Put on clean/comfortable clothes.  Brush your teeth.  Ask your nurse before applying any prescription medications to the skin.   [place]- Preparing for Total Shoulder Arthroplasty    Before surgery, you can play an important role. Because skin is not sterile, your skin needs to be as free of germs as possible. You can reduce the number of germs on your skin by using the following products. Benzoyl Peroxide Gel Reduces the number of germs present on the skin Applied twice a day to shoulder area starting two days before surgery    ==================================================================  Please follow these instructions carefully:  BENZOYL PEROXIDE 5% GEL  Please do not use if you have an allergy to benzoyl peroxide.   If your skin becomes reddened/irritated stop using the benzoyl peroxide.  Starting two days before surgery, apply as follows: Apply benzoyl peroxide in the morning and at night. Apply after taking a shower. If you are not taking a shower clean entire shoulder front, back, and side along with the armpit with a clean wet washcloth.  Place a quarter-sized dollop on your shoulder and rub in thoroughly, making sure to cover the front, back, and side of your shoulder, along with the armpit.   2 days before ____ AM   ____ PM              1 day before ____ AM   ____ PM                         Do this twice a day for two days.   (Last application is the night before surgery, AFTER using the CHG soap as described below).  Do NOT apply benzoyl peroxide gel on the day of surgery.   CHG Compatible Lotions   Aveeno Moisturizing lotion  Cetaphil Moisturizing Cream  Cetaphil Moisturizing Lotion  Clairol Herbal Essence Moisturizing Lotion, Dry Skin  Clairol Herbal Essence Moisturizing Lotion, Extra Dry Skin  Clairol Herbal Essence Moisturizing Lotion, Normal Skin  Curel Age Defying Therapeutic Moisturizing Lotion with Alpha Hydroxy  Curel Extreme Care Body Lotion  Curel Soothing Hands Moisturizing Hand Lotion  Curel Therapeutic Moisturizing Cream, Fragrance-Free  Curel Therapeutic Moisturizing  Lotion, Fragrance-Free  Curel Therapeutic Moisturizing Lotion, Original Formula  Eucerin Daily Replenishing Lotion  Eucerin Dry Skin Therapy Plus Alpha Hydroxy Crme  Eucerin Dry Skin Therapy Plus Alpha Hydroxy Lotion  Eucerin Original Crme  Eucerin Original Lotion  Eucerin Plus Crme Eucerin Plus Lotion  Eucerin TriLipid Replenishing Lotion  Keri Anti-Bacterial Hand Lotion  Keri Deep Conditioning Original Lotion Dry Skin Formula Softly Scented  Keri Deep Conditioning Original Lotion, Fragrance Free Sensitive Skin Formula  Keri Lotion Fast Absorbing Fragrance Free Sensitive Skin Formula  Keri Lotion Fast Absorbing Softly Scented Dry Skin Formula  Keri Original Lotion  Keri Skin Renewal Lotion Keri Silky Smooth Lotion  Keri Silky Smooth Sensitive Skin Lotion  Nivea Body Creamy Conditioning Oil  Nivea Body Extra Enriched Lotion  Nivea Body Original Lotion  Nivea Body Sheer Moisturizing Lotion Nivea Crme  Nivea Skin Firming Lotion  NutraDerm 30 Skin Lotion  NutraDerm Skin Lotion  NutraDerm Therapeutic Skin Cream  NutraDerm Therapeutic Skin Lotion  ProShield Protective Hand Cream      Incentive Spirometer  An incentive spirometer is a tool that can help keep your lungs clear and active. This tool measures how  well you are filling your lungs with each breath. Taking long deep breaths may help reverse or decrease the chance of developing breathing (pulmonary) problems (especially infection) following: A long period of time when you are unable to move or be active. BEFORE THE PROCEDURE  If the spirometer includes an indicator to show your best effort, your nurse or respiratory therapist will set it to a desired goal. If possible, sit up straight or lean slightly forward. Try not to slouch. Hold the incentive spirometer in an upright position. INSTRUCTIONS FOR USE  Sit on the edge of your bed if possible, or sit up as far as you can in bed or on a chair. Hold the incentive spirometer in an upright position. Breathe out normally. Place the mouthpiece in your mouth and seal your lips tightly around it. Breathe in slowly and as deeply as possible, raising the piston or the ball toward the top of the column. Hold your breath for 3-5 seconds or for as long as possible. Allow the piston or ball to fall to the bottom of the column. Remove the mouthpiece from your mouth and breathe out normally. Rest for a few seconds and repeat Steps 1 through 7 at least 10 times every 1-2 hours when you are awake. Take your time and take a few normal breaths between deep breaths. The spirometer may include an indicator to show your best effort. Use the indicator as a goal to work toward during each repetition. After each set of 10 deep breaths, practice coughing to be sure your lungs are clear. If you have an incision (the cut made at the time of surgery), support your incision when coughing by placing a pillow or rolled up towels firmly against it. Once you are able to get out of bed, walk around indoors and cough well. You may stop using the incentive spirometer when instructed by your caregiver.  RISKS AND COMPLICATIONS Take your time so you do not get dizzy or light-headed. If you are in pain, you may need to take or ask  for pain medication before doing incentive spirometry. It is harder to take a deep breath if you are having pain. AFTER USE Rest and breathe slowly and easily. It can be helpful to keep track of a log of your progress. Your caregiver can provide you  with a simple table to help with this. If you are using the spirometer at home, follow these instructions: SEEK MEDICAL CARE IF:  You are having difficultly using the spirometer. You have trouble using the spirometer as often as instructed. Your pain medication is not giving enough relief while using the spirometer. You develop fever of 100.5 F (38.1 C) or higher. SEEK IMMEDIATE MEDICAL CARE IF:  You cough up bloody sputum that had not been present before. You develop fever of 102 F (38.9 C) or greater. You develop worsening pain at or near the incision site. MAKE SURE YOU:  Understand these instructions. Will watch your condition. Will get help right away if you are not doing well or get worse. Document Released: 10/05/2006 Document Revised: 08/17/2011 Document Reviewed: 12/06/2006 Exit[employer] Patient Information 2014 [employer], [place].   ________________________________________________________________________  WHAT IS A BLOOD TRANSFUSION? Blood Transfusion Information  A transfusion is the replacement of blood or some of its parts. Blood is made up of multiple cells which provide different functions. Red blood cells carry oxygen and are used for blood loss replacement. White blood cells fight against infection. Platelets control bleeding. Plasma helps clot blood. Other blood products are available for specialized needs, such as hemophilia or other clotting disorders. BEFORE THE TRANSFUSION  Who gives blood for transfusions?  Healthy volunteers who are fully evaluated to make sure their blood is safe. This is blood bank blood. Transfusion therapy is the safest it has ever been in the practice of medicine. Before blood is taken from a  donor, a complete history is taken to make sure that person has no history of diseases nor engages in risky social behavior (examples are intravenous drug use or sexual activity with multiple partners). The donor's travel history is screened to minimize risk of transmitting infections, such as malaria. The donated blood is tested for signs of infectious diseases, such as HIV and hepatitis. The blood is then tested to be sure it is compatible with you in order to minimize the chance of a transfusion reaction. If you or a relative donates blood, this is often done in anticipation of surgery and is not appropriate for emergency situations. It takes many days to process the donated blood. RISKS AND COMPLICATIONS Although transfusion therapy is very safe and saves many lives, the main dangers of transfusion include:  Getting an infectious disease. Developing a transfusion reaction. This is an allergic reaction to something in the blood you were given. Every precaution is taken to prevent this. The decision to have a blood transfusion has been considered carefully by your caregiver before blood is given. Blood is not given unless the benefits outweigh the risks. AFTER THE TRANSFUSION Right after receiving a blood transfusion, you will usually feel much better and more energetic. This is especially true if your red blood cells have gotten low (anemic). The transfusion raises the level of the red blood cells which carry oxygen, and this usually causes an energy increase. The nurse administering the transfusion will monitor you carefully for complications. HOME CARE INSTRUCTIONS  No special instructions are needed after a transfusion. You may find your energy is better. Speak with your caregiver about any limitations on activity for underlying diseases you may have. SEEK MEDICAL CARE IF:  Your condition is not improving after your transfusion. You develop redness or irritation at the intravenous (IV) site. SEEK  IMMEDIATE MEDICAL CARE IF:  Any of the following symptoms occur over the next 12 hours: Shaking chills. You have a  temperature by mouth above 102 F (38.9 C), not controlled by medicine. Chest, back, or muscle pain. People around you feel you are not acting correctly or are confused. Shortness of breath or difficulty breathing. Dizziness and fainting. You get a rash or develop hives. You have a decrease in urine output. Your urine turns a dark color or changes to pink, red, or brown. Any of the following symptoms occur over the next 10 days: You have a temperature by mouth above 102 F (38.9 C), not controlled by medicine. Shortness of breath. Weakness after normal activity. The white part of the eye turns yellow (jaundice). You have a decrease in the amount of urine or are urinating less often. Your urine turns a dark color or changes to pink, red, or brown. Document Released: 05/22/2000 Document Revised: 08/17/2011 Document Reviewed: 01/09/2008 [facility] Patient Information 2014 [place], [place].  _______________________________________________________________________

## 2024-06-22 ENCOUNTER — Encounter (HOSPITAL_COMMUNITY): Payer: Self-pay

## 2024-06-22 ENCOUNTER — Encounter (HOSPITAL_COMMUNITY)
Admission: RE | Admit: 2024-06-22 | Discharge: 2024-06-22 | Disposition: A | Discharge status: Home / Self Care | Source: Ambulatory Visit | Attending: Orthopedic Surgery | Admitting: Orthopedic Surgery

## 2024-06-22 ENCOUNTER — Other Ambulatory Visit: Payer: Self-pay

## 2024-06-22 VITALS — BP 163/74 | HR 80 | Temp 98.6°F | Resp 16 | Ht 65.0 in | Wt 250.0 lb

## 2024-06-22 DIAGNOSIS — Z01812 Encounter for preprocedural laboratory examination: Secondary | ICD-10-CM | POA: Insufficient documentation

## 2024-06-22 DIAGNOSIS — M19012 Primary osteoarthritis, left shoulder: Secondary | ICD-10-CM | POA: Diagnosis not present

## 2024-06-22 DIAGNOSIS — R5383 Other fatigue: Secondary | ICD-10-CM | POA: Diagnosis not present

## 2024-06-22 DIAGNOSIS — Z01818 Encounter for other preprocedural examination: Secondary | ICD-10-CM

## 2024-06-22 DIAGNOSIS — R0602 Shortness of breath: Secondary | ICD-10-CM | POA: Diagnosis not present

## 2024-06-22 HISTORY — DX: Calculus of gallbladder without cholecystitis without obstruction: K80.20

## 2024-06-22 HISTORY — DX: Myoneural disorder, unspecified: G70.9

## 2024-06-22 HISTORY — DX: Depression, unspecified: F32.A

## 2024-06-22 HISTORY — DX: Unspecified macular degeneration: H35.30

## 2024-06-22 LAB — CBC
HCT: 40.6 % (ref 36.0–46.0)
Hemoglobin: 13.1 g/dL (ref 12.0–15.0)
MCH: 29.4 pg (ref 26.0–34.0)
MCHC: 32.3 g/dL (ref 30.0–36.0)
MCV: 91.2 fL (ref 80.0–100.0)
Platelets: 283 K/uL (ref 150–400)
RBC: 4.45 MIL/uL (ref 3.87–5.11)
RDW: 13.4 % (ref 11.5–15.5)
WBC: 16.9 K/uL — ABNORMAL HIGH (ref 4.0–10.5)
nRBC: 0 % (ref 0.0–0.2)

## 2024-06-22 LAB — BASIC METABOLIC PANEL WITH GFR
Anion gap: 11 (ref 5–15)
BUN: 21 mg/dL (ref 8–23)
CO2: 22 mmol/L (ref 22–32)
Calcium: 9.6 mg/dL (ref 8.9–10.3)
Chloride: 107 mmol/L (ref 98–111)
Creatinine, Ser: 1.07 mg/dL — ABNORMAL HIGH (ref 0.44–1.00)
GFR, Estimated: 59 mL/min — ABNORMAL LOW
Glucose, Bld: 141 mg/dL — ABNORMAL HIGH (ref 70–99)
Potassium: 3.9 mmol/L (ref 3.5–5.1)
Sodium: 139 mmol/L (ref 135–145)

## 2024-06-22 LAB — SURGICAL PCR SCREEN
MRSA, PCR: NEGATIVE
Staphylococcus aureus: NEGATIVE

## 2024-06-24 NOTE — Anesthesia Preprocedure Evaluation (Signed)
"                                    Anesthesia Evaluation    Airway        Dental   Pulmonary former smoker          Cardiovascular      Neuro/Psych    GI/Hepatic   Endo/Other    Renal/GU      Musculoskeletal   Abdominal   Peds  Hematology   Anesthesia Other Findings   Reproductive/Obstetrics                              Anesthesia Physical Anesthesia Plan  ASA:   Anesthesia Plan:    Post-op Pain Management:    Induction:   PONV Risk Score and Plan:   Airway Management Planned:   Additional Equipment:   Intra-op Plan:   Post-operative Plan:   Informed Consent:   Plan Discussed with:   Anesthesia Plan Comments: (See PAT note from 1/15)         Anesthesia Quick Evaluation  "

## 2024-06-24 NOTE — Progress Notes (Signed)
 " Case: [id number] Date/Time: 06/30/24 1234   Procedure: REVISION, REVERSE TOTAL ARTHROPLASTY, SHOULDER (Left: Shoulder)   Anesthesia type: Choice   Diagnosis: Arthritis of left shoulder region [M19.012]   Pre-op diagnosis: Osteoarthritis of left shoulder, unspecified osteoarthritis type   Location: [facility] ROOM 08 / [facility]   Surgeons: Sharl Selinda Dover, MD       DISCUSSION: Stephanie Hensley is a 64 yo female with PMH of former smoking, coronary calcifications, asthma, migraines, peripheral neuropathy, CKD3, PTSD, depression, arthritis, obesity (BMI 41).  Patient follows with Cardiology for coronary calcifications on imaging. Last seen by Dr. Pietro on 12/28/23. She denied any cardiac symptoms. Advised continue medical therapy and f/u in 1 year.  Seen by PCP on 04/28/24 for routine f/u and for f/u from an ED visit due to a fall. She was diagnosed with a possible nasal bone fx. Clearance scanned in media on 04/18/24 that patient is cleared from medical/cardiac perspective.  At PAT visit she reports she can do a flight of stairs without CP/SOB.  VS: BP (!) 163/74   Pulse 80   Temp 37 C (Oral)   Resp 16   Ht 5' 5 (1.651 m)   Wt 113.4 kg   LMP 12/24/2010   SpO2 97%   BMI 41.60 kg/m   PROVIDERS: Murleen Fine, MD   LABS: Labs reviewed: Acceptable for surgery. WBC 16. Routed to Dr. Sharl. (all labs ordered are listed, but only abnormal results are displayed)  Labs Reviewed  BASIC METABOLIC PANEL WITH GFR - Abnormal; Notable for the following components:      Result Value   Glucose, Bld 141 (*)    Creatinine, Ser 1.07 (*)    GFR, Estimated 59 (*)    All other components within normal limits  CBC - Abnormal; Notable for the following components:   WBC 16.9 (*)    All other components within normal limits  SURGICAL PCR SCREEN  TYPE AND SCREEN     CT Calcium  score 09/12/2021:  IMPRESSION: Coronary calcium  score of 351. This was 98th percentile for age-, race-, and  sex-matched controls.  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   Pre-diabetes    PTSD (post-traumatic stress disorder)     Past Surgical History:  Procedure Laterality Date   cataractremopval Right    x2   CHOLECYSTECTOMY     HERNIA REPAIR     umbilical hernia repair   KNEE SURGERY Left 2002   Right leg surgery  2021   tib fib osteotomy   SHOULDER HEMI-ARTHROPLASTY Left 06/18/2016   Procedure: LEFT SHOULDER HEMI-ARTHROPLASTY;  Surgeon: Selinda Dover Sharl, MD;  Location: [facility] OR;  Service: Orthopedics;  Laterality: Left;   TUBAL LIGATION      MEDICATIONS:  acetaminophen  (TYLENOL ) 500 MG tablet   albuterol  (VENTOLIN  HFA) 108 (90 Base) MCG/ACT inhaler   CALCIUM  PO   cetirizine (ZYRTEC) 10 MG tablet   Evolocumab  (REPATHA  SURECLICK) 140 MG/ML SOAJ   ezetimibe  (ZETIA ) 10 MG tablet  Ferrous Sulfate (SLOW RELEASE IRON PO)   hydrochlorothiazide  (MICROZIDE ) 12.5 MG capsule   Menaquinone-7 (VITAMIN K2 PO)   Multiple Vitamins-Minerals (ALIVE WOMENS 50+) CHEW   Multiple Vitamins-Minerals (PRESERVISION AREDS 2 PO)   Phenazopyridine HCl (AZO TABS PO)   traMADol  HCl 100 MG TABS   No current facility-administered medications for this encounter.   Burnard CHRISTELLA Odis DEVONNA [facility] Surgical Short Stay/Anesthesiology [facility] Phone [phone] 06/24/2024 3:44 PM        "

## 2024-06-30 ENCOUNTER — Ambulatory Visit (HOSPITAL_COMMUNITY)
Admission: RE | Admit: 2024-06-30 | Discharge: 2024-06-30 | Disposition: A | Discharge status: Home / Self Care | Source: Ambulatory Visit | Attending: Orthopedic Surgery | Admitting: Orthopedic Surgery

## 2024-06-30 ENCOUNTER — Encounter (HOSPITAL_COMMUNITY): Payer: Self-pay | Admitting: Orthopedic Surgery

## 2024-06-30 ENCOUNTER — Other Ambulatory Visit: Payer: Self-pay

## 2024-06-30 ENCOUNTER — Ambulatory Visit (HOSPITAL_COMMUNITY): Admitting: Anesthesiology

## 2024-06-30 ENCOUNTER — Other Ambulatory Visit (HOSPITAL_COMMUNITY): Payer: Self-pay

## 2024-06-30 ENCOUNTER — Encounter (HOSPITAL_COMMUNITY)
Admission: RE | Disposition: A | Discharge status: Home / Self Care | Payer: Self-pay | Source: Ambulatory Visit | Attending: Orthopedic Surgery

## 2024-06-30 ENCOUNTER — Ambulatory Visit (HOSPITAL_COMMUNITY): Payer: Self-pay | Admitting: Anesthesiology

## 2024-06-30 DIAGNOSIS — I10 Essential (primary) hypertension: Secondary | ICD-10-CM | POA: Insufficient documentation

## 2024-06-30 DIAGNOSIS — J449 Chronic obstructive pulmonary disease, unspecified: Secondary | ICD-10-CM

## 2024-06-30 DIAGNOSIS — Z87891 Personal history of nicotine dependence: Secondary | ICD-10-CM | POA: Diagnosis not present

## 2024-06-30 DIAGNOSIS — J4489 Other specified chronic obstructive pulmonary disease: Secondary | ICD-10-CM | POA: Diagnosis not present

## 2024-06-30 DIAGNOSIS — Z79899 Other long term (current) drug therapy: Secondary | ICD-10-CM | POA: Insufficient documentation

## 2024-06-30 DIAGNOSIS — I2510 Atherosclerotic heart disease of native coronary artery without angina pectoris: Secondary | ICD-10-CM | POA: Diagnosis not present

## 2024-06-30 DIAGNOSIS — M19012 Primary osteoarthritis, left shoulder: Secondary | ICD-10-CM | POA: Diagnosis not present

## 2024-06-30 LAB — TYPE AND SCREEN
ABO/RH(D): A POS
Antibody Screen: NEGATIVE

## 2024-06-30 LAB — ABO/RH: ABO/RH(D): A POS

## 2024-06-30 MED ORDER — SUGAMMADEX SODIUM 200 MG/2ML IV SOLN
INTRAVENOUS | Status: DC | PRN
  Administered 2024-06-30: 200 mg via INTRAVENOUS

## 2024-06-30 MED ORDER — ONDANSETRON HCL 4 MG/2ML IJ SOLN
INTRAMUSCULAR | Status: AC
  Filled 2024-06-30: qty 2

## 2024-06-30 MED ORDER — DEXAMETHASONE SOD PHOSPHATE PF 10 MG/ML IJ SOLN
INTRAMUSCULAR | Status: DC | PRN
  Administered 2024-06-30: 4 mg via INTRAVENOUS

## 2024-06-30 MED ORDER — VANCOMYCIN HCL 1000 MG IV SOLR
INTRAVENOUS | Status: DC | PRN
  Administered 2024-06-30: 1000 mg via TOPICAL

## 2024-06-30 MED ORDER — TRANEXAMIC ACID-NACL 1000-0.7 MG/100ML-% IV SOLN
1000.0000 mg | INTRAVENOUS | Status: AC
  Administered 2024-06-30: 1000 mg via INTRAVENOUS
  Filled 2024-06-30: qty 100

## 2024-06-30 MED ORDER — PROPOFOL 10 MG/ML IV BOLUS
INTRAVENOUS | Status: DC | PRN
  Administered 2024-06-30: 160 mg via INTRAVENOUS

## 2024-06-30 MED ORDER — DEXAMETHASONE SOD PHOSPHATE PF 10 MG/ML IJ SOLN
INTRAMUSCULAR | Status: AC
  Filled 2024-06-30: qty 1

## 2024-06-30 MED ORDER — BUPIVACAINE-EPINEPHRINE (PF) 0.5% -1:200000 IJ SOLN
INTRAMUSCULAR | Status: DC | PRN
  Administered 2024-06-30: 10 mL via PERINEURAL

## 2024-06-30 MED ORDER — HYDROMORPHONE HCL 1 MG/ML IJ SOLN
INTRAMUSCULAR | Status: AC
  Filled 2024-06-30: qty 1

## 2024-06-30 MED ORDER — ONDANSETRON HCL 4 MG/2ML IJ SOLN
INTRAMUSCULAR | Status: DC | PRN
  Administered 2024-06-30: 4 mg via INTRAVENOUS

## 2024-06-30 MED ORDER — 0.9 % SODIUM CHLORIDE (POUR BTL) OPTIME
TOPICAL | Status: DC | PRN
  Administered 2024-06-30 (×2): 1000 mL

## 2024-06-30 MED ORDER — BUPIVACAINE LIPOSOME 1.3 % IJ SUSP
INTRAMUSCULAR | Status: DC | PRN
  Administered 2024-06-30: 10 mL via PERINEURAL

## 2024-06-30 MED ORDER — ORAL CARE MOUTH RINSE: 15.0000 mL | Freq: Once | OROMUCOSAL | Status: AC

## 2024-06-30 MED ORDER — ONDANSETRON 4 MG PO TBDP
4.0000 mg | ORAL_TABLET | Freq: Three times a day (TID) | ORAL | 0 refills | Status: AC | PRN
  Filled 2024-06-30: qty 20, 7d supply, fill #0

## 2024-06-30 MED ORDER — FENTANYL CITRATE (PF) 100 MCG/2ML IJ SOLN
INTRAMUSCULAR | Status: AC
  Filled 2024-06-30: qty 2

## 2024-06-30 MED ORDER — PROPOFOL 10 MG/ML IV BOLUS
INTRAVENOUS | Status: AC
  Filled 2024-06-30: qty 20

## 2024-06-30 MED ORDER — LACTATED RINGERS IV SOLN: INTRAVENOUS | Status: DC

## 2024-06-30 MED ORDER — MIDAZOLAM HCL 2 MG/2ML IJ SOLN
INTRAMUSCULAR | Status: AC
  Filled 2024-06-30: qty 2

## 2024-06-30 MED ORDER — LIDOCAINE HCL (PF) 2 % IJ SOLN
INTRAMUSCULAR | Status: AC
  Filled 2024-06-30: qty 5

## 2024-06-30 MED ORDER — FENTANYL CITRATE (PF) 50 MCG/ML IJ SOSY
50.0000 ug | PREFILLED_SYRINGE | INTRAMUSCULAR | Status: DC
  Administered 2024-06-30: 100 ug via INTRAVENOUS
  Filled 2024-06-30: qty 2

## 2024-06-30 MED ORDER — CEFAZOLIN SODIUM-DEXTROSE 2-4 GM/100ML-% IV SOLN
2.0000 g | INTRAVENOUS | Status: AC
  Administered 2024-06-30: 2 g via INTRAVENOUS
  Filled 2024-06-30: qty 100

## 2024-06-30 MED ORDER — ISOPROPYL ALCOHOL 70 % SOLN
Status: AC
  Filled 2024-06-30: qty 480

## 2024-06-30 MED ORDER — MIDAZOLAM HCL (PF) 2 MG/2ML IJ SOLN: 0.5000 mg | Freq: Once | INTRAMUSCULAR | Status: DC | PRN

## 2024-06-30 MED ORDER — HYDROMORPHONE HCL 1 MG/ML IJ SOLN
0.2500 mg | INTRAMUSCULAR | Status: DC | PRN
  Administered 2024-06-30: 0.25 mg via INTRAVENOUS

## 2024-06-30 MED ORDER — ROCURONIUM BROMIDE 100 MG/10ML IV SOLN
INTRAVENOUS | Status: DC | PRN
  Administered 2024-06-30: 80 mg via INTRAVENOUS

## 2024-06-30 MED ORDER — MIDAZOLAM HCL (PF) 2 MG/2ML IJ SOLN
1.0000 mg | INTRAMUSCULAR | Status: DC
  Administered 2024-06-30: 2 mg via INTRAVENOUS
  Filled 2024-06-30: qty 2

## 2024-06-30 MED ORDER — ACETAMINOPHEN 500 MG PO TABS
1000.0000 mg | ORAL_TABLET | Freq: Once | ORAL | Status: AC
  Administered 2024-06-30: 1000 mg via ORAL
  Filled 2024-06-30: qty 2

## 2024-06-30 MED ORDER — OXYCODONE HCL 5 MG PO TABS: 5.0000 mg | ORAL_TABLET | Freq: Once | ORAL | Status: DC | PRN

## 2024-06-30 MED ORDER — SUGAMMADEX SODIUM 200 MG/2ML IV SOLN
INTRAVENOUS | Status: AC
  Filled 2024-06-30: qty 2

## 2024-06-30 MED ORDER — ROCURONIUM BROMIDE 10 MG/ML (PF) SYRINGE
PREFILLED_SYRINGE | INTRAVENOUS | Status: AC
  Filled 2024-06-30: qty 10

## 2024-06-30 MED ORDER — PHENYLEPHRINE HCL-NACL 20-0.9 MG/250ML-% IV SOLN
INTRAVENOUS | Status: AC
  Filled 2024-06-30: qty 250

## 2024-06-30 MED ORDER — PHENYLEPHRINE HCL-NACL 20-0.9 MG/250ML-% IV SOLN
INTRAVENOUS | Status: DC | PRN
  Administered 2024-06-30: 25 ug/min via INTRAVENOUS

## 2024-06-30 MED ORDER — VANCOMYCIN HCL 1000 MG IV SOLR
INTRAVENOUS | Status: AC
  Filled 2024-06-30: qty 20

## 2024-06-30 MED ORDER — OXYCODONE HCL 5 MG/5ML PO SOLN: 5.0000 mg | Freq: Once | ORAL | Status: DC | PRN

## 2024-06-30 MED ORDER — OXYCODONE HCL 5 MG PO TABS
5.0000 mg | ORAL_TABLET | ORAL | 0 refills | Status: AC | PRN
  Filled 2024-06-30: qty 30, 5d supply, fill #0

## 2024-06-30 MED ORDER — CHLORHEXIDINE GLUCONATE 0.12 % MT SOLN
15.0000 mL | Freq: Once | OROMUCOSAL | Status: AC
  Administered 2024-06-30: 15 mL via OROMUCOSAL

## 2024-06-30 NOTE — Op Note (Signed)
 06/30/2024  12:54 PM  PATIENT:  Stephanie Hensley    PRE-OPERATIVE DIAGNOSIS:  Osteoarthritis of left shoulder, unspecified osteoarthritis type  POST-OPERATIVE DIAGNOSIS:  Same  PROCEDURE:   1.  Revision of hemiarthroplasty with conversion to reverse total shoulder arthroplasty.  Revision all components.  SURGEON:  Selinda Belvie Gosling, MD  ASSISTANT: Dayle Moores, PA-C  Assistant attestation:  PA Mcclung scrubbed and present for the entire procedure.  ANESTHESIA:   General With interscalene block  ESTIMATED BLOOD LOSS: 300 cc  PREOPERATIVE INDICATIONS:  Stephanie Hensley is a  64 y.o. female with a diagnosis of Osteoarthritis of left shoulder, unspecified osteoarthritis type who failed conservative measures and elected for surgical management.    The risks benefits and alternatives were discussed with the patient preoperatively including but not limited to the risks of infection, bleeding, nerve injury, cardiopulmonary complications, the need for revision surgery, dislocation, brachial plexus palsy, incomplete relief of pain, among others, and the patient was willing to proceed.  OPERATIVE IMPLANTS:  Explant of Biomet hemiarthroplasty \ Size medium augment mini baseplate with a 25 mm central compression screw and 4 peripheral locking screws with a size 36+3 glenosphere  Biomet comprehensive size mini 5 stem with a standard humeral tray and a standard comprehensive polyethylene bearing   OPERATIVE FINDINGS: Extensive arthritic change of the glenoid.  There was superior wear.  She had intact repaired capsular tissue anterior and superior as well as posterior.  However, the superior rotator cuff was completely deficient.  There was high riding of the humerus.  Humeral stem was well-fixed.  No signs of infectious process.  OPERATIVE PROCEDURE: The patient was brought to the operating room and placed in the supine position. General anesthesia was administered. IV antibiotics were given. A  Foley was not placed. Time out was performed. The upper extremity was prepped and draped in usual sterile fashion. The patient was in a beachchair position. Deltopectoral approach was carried out.  We utilized the previous deltopectoral incision.  We encountered the cephalic vein.  This was significantly scarred and consistent with revision surgery.  This was suture-ligated at the distal aspect of the incision.  Deep retractors were then placed and extensive releases were performed along the humerus.  The humerus was then dislocated.  We used the tuning fork device to on couple the humeral head from the stem.  This came out without any issues.  Deep retractors were placed, and I resected the labrum, and then placed a guidepin into the center position on the glenoid, with slight inferior inclination.  We did use the medium augment.  We secondarily reamed for the medium augment.  I then reamed over the guidepin, and this created a small metaphyseal cancellus blush inferiorly, removing just the cartilage to the subchondral bone superiorly. The base plate was selected and impacted place, and then I secured it centrally with a nonlocking screw, and I had excellent purchase both inferiorly and superiorly. I placed a short locking screws on anterior and posterior aspects.  I then turned my attention to the glenosphere, and impacted this into place, placing slight inferior offset (set on A).   The glenoid sphere was completely seated, and had engagement of the [facility] taper. I then turned my attention back to the humerus.  We attempted to maintain the previously utilized humeral stem.  This was rotationally stable and had good height.  However found this to be too tight for the conversion to the reverse.  Therefore we extracted and explanted the humeral stem.  We first removed the proximal bone with rondure.  We then chiseled away some of the cement mantle.  The extraction mallet was then used after bolting it to the  proximal aspect of the humeral stem.  This came out without issue with no iatrogenic fracture.  We then utilized the comprehensive mini stem reamers up to a size 6.  We then trialed broaches.  The left ultimately the size 5 mini broach had good height as well as rotational stability.  We trialed this with a standard humeral tray and bearing.  This had excellent stability through all ranges of motion.  Trial implants were removed.  We copiously irrigated the wound with normal saline.  We then implanted the final size 5 mini humeral stem as well as a standard tray and polyethylene liner.  This had excellent stability.  I then irrigated the shoulder copiously once more, 1 g of vancomycin powder placed in the wound, repaired the deltopectoral interval with #2 FiberWire followed by subcutaneous Vicryl, then monocryl for the skin,  with Steri-Strips and sterile gauze for the skin. The patient was awakened and returned back in stable and satisfactory condition. There no complications and they tolerated the procedure well.  All counts were correct x2.    Disposition:  Luke will be discharged home today from PACU.  Should be in her sling for 4 weeks.  She can use the arm for activities of daily living, but will need to wear the sling when sleeping and out of the house.  She can begin showering in 2 days.  I will see her back in the office with 2 x-ray views left shoulder AP and scapular Y in 2 weeks.

## 2024-06-30 NOTE — Discharge Instructions (Addendum)
 Orthopedic surgery discharge instructions:  -Maintain postoperative bandage until follow-up appointment.  This is waterproof, and you may begin showering on postoperative day #3.  Do not submerge underwater.  Maintain that bandage until your follow-up appointment in 2 weeks.  -No lifting over 2 pounds with operateive arm.  You may use the arm immediately for activities of daily living such as bathing, washing your face and brushing your teeth, eating.  Otherwise maintain your sling when you are out of the house and sleeping.  -Apply ice liberally to the shoulder throughout the day.  For mild to moderate pain use Tylenol  and Advil  as needed around-the-clock.  For breakthrough pain use oxycodone  as necessary.  -You will return to see Dr. Sharl in the office in 2 weeks for routine postoperative check with x-rays.

## 2024-06-30 NOTE — Progress Notes (Signed)
 Ted hose off at patients insisting.Patient states she will not wear them at home.

## 2024-06-30 NOTE — Transfer of Care (Signed)
 Immediate Anesthesia Transfer of Care Note  Patient: Stephanie Hensley  Procedure(s) Performed: REVISION, REVERSE TOTAL ARTHROPLASTY, SHOULDER, CONVERSION HEMIARTHROPLASTY TO REVERSE ARTHROPLASTY (Left: Shoulder)  Patient Location: PACU  Anesthesia Type:General and Regional  Level of Consciousness: awake, alert , oriented, and patient cooperative  Airway & Oxygen Therapy: Patient Spontanous Breathing and Patient connected to face mask oxygen  Post-op Assessment: Report given to RN and Post -op Vital signs reviewed and stable  Post vital signs: Reviewed and stable  Last Vitals:  Vitals Value Taken Time  BP 124/104 06/30/24 13:24  Temp    Pulse 79 06/30/24 13:26  Resp 19 06/30/24 13:26  SpO2 98 % 06/30/24 13:26  Vitals shown include unfiled device data.  Last Pain:  Vitals:   06/30/24 1100  TempSrc:   PainSc: 0-No pain      Patients Stated Pain Goal: 5 (06/30/24 0855)  Complications: No notable events documented.

## 2024-06-30 NOTE — Anesthesia Procedure Notes (Signed)
 Anesthesia Regional Block: Interscalene brachial plexus block   Pre-Anesthetic Checklist: , timeout performed,  Correct Patient, Correct Site, Correct Laterality,  Correct Procedure, Correct Position, site marked,  Risks and benefits discussed,  Surgical consent,  Pre-op evaluation,  At surgeon's request and post-op pain management  Laterality: Left and Upper  Prep: chloraprep       Needles:  Injection technique: Single-shot  Needle Type: Echogenic Needle     Needle Length: 9cm  Needle Gauge: 21     Additional Needles:   Procedures:,,,, ultrasound used (permanent image in chart),,    Narrative:  Start time: 06/30/2024 10:48 AM End time: 06/30/2024 10:55 AM Injection made incrementally with aspirations every 5 mL.  Performed by: Personally  Anesthesiologist: Leonce Athens, MD  Additional Notes: Pt identified in Holding room.  Monitors applied. Working IV access confirmed. Timeout, Sterile prep L clavicle and neck.  #21ga ECHOgenic Arrow block needle to interscalene brachial plexus with US  guidance.  10cc 0.5% Bupivacaine  1:200k epi, Exparel  injected incrementally after negative test dose.  Patient asymptomatic, VSS, no heme aspirated, tolerated well.  JAYSON Leonce, MD

## 2024-06-30 NOTE — Anesthesia Postprocedure Evaluation (Signed)
"   Anesthesia Post Note  Patient: Stephanie Hensley  Procedure(s) Performed: REVISION, REVERSE TOTAL ARTHROPLASTY, SHOULDER, CONVERSION HEMIARTHROPLASTY TO REVERSE ARTHROPLASTY (Left: Shoulder)     Patient location during evaluation: PACU Anesthesia Type: General Level of consciousness: awake and alert, oriented and patient cooperative Pain management: pain level controlled Vital Signs Assessment: post-procedure vital signs reviewed and stable Respiratory status: spontaneous breathing, nonlabored ventilation and respiratory function stable Cardiovascular status: blood pressure returned to baseline and stable Postop Assessment: no apparent nausea or vomiting Anesthetic complications: no   No notable events documented.  Last Vitals:  Vitals:   06/30/24 1330 06/30/24 1345  BP: (!) 129/49 122/64  Pulse: 76 77  Resp: 17 15  Temp:    SpO2: 93% 90%    Last Pain:  Vitals:   06/30/24 1345  TempSrc:   PainSc: 5                  Tnya Ades,E. Dalexa Gentz      "

## 2024-06-30 NOTE — Anesthesia Procedure Notes (Signed)
 Procedure Name: Intubation Date/Time: 06/30/2024 11:17 AM  Performed by: Nada Corean CROME, CRNAPre-anesthesia Checklist: Emergency Drugs available, Patient identified, Suction available, Patient being monitored and Timeout performed Patient Re-evaluated:Patient Re-evaluated prior to induction Oxygen Delivery Method: Circle system utilized Preoxygenation: Pre-oxygenation with 100% oxygen Induction Type: IV induction Ventilation: Mask ventilation without difficulty Laryngoscope Size: Mac and 3 Grade View: Grade I Tube type: Oral Tube size: 7.0 mm Number of attempts: 1 Airway Equipment and Method: Stylet Placement Confirmation: ETT inserted through vocal cords under direct vision, positive ETCO2, CO2 detector and breath sounds checked- equal and bilateral Secured at: 22 cm Tube secured with: Tape Dental Injury: Teeth and Oropharynx as per pre-operative assessment

## 2024-06-30 NOTE — H&P (Signed)
 "  ORTHOPAEDIC H&P  REQUESTING PHYSICIAN: Sharl Selinda Dover, MD  PCP:  Murleen Fine, MD  Chief Complaint: Left shoulder glenoid osteoarthritis  HPI: Stephanie Hensley is a 64 y.o. female who complains of left shoulder pain worsening over the years.  She had a history of proximal humerus fracture treated with a hemiarthroplasty back in 2017.  Here today for conversion of that to a reverse.  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   Pre-diabetes    PTSD (post-traumatic stress disorder)    Past Surgical History:  Procedure Laterality Date   cataractremopval Right    x2   CHOLECYSTECTOMY     HERNIA REPAIR     umbilical hernia repair   KNEE SURGERY Left 2002   Right leg surgery  2021   tib fib osteotomy   SHOULDER HEMI-ARTHROPLASTY Left 06/18/2016   Procedure: LEFT SHOULDER HEMI-ARTHROPLASTY;  Surgeon: Selinda Dover Sharl, MD;  Location[facility];  Service: Orthopedics;  Laterality: Left;   TUBAL LIGATION     Social History   Socioeconomic History   Marital status: Single    Spouse name: Not on file   Number of children: 2   Years of education: Not on file   Highest education level: Associate degree: occupational, scientist, product/process development, or vocational program  Occupational History   Not on file  Tobacco Use   Smoking status: Former    Current packs/day: 0.00    Types: Cigarettes    Quit date: 06/17/2012    Years since quitting: 12.0    Passive exposure: Past   Smokeless tobacco: Never   Tobacco comments:    Quit 13 yrs ago  as of 06-2024  Vaping Use   Vaping status: Former  Substance and Sexual Activity    Alcohol use: Yes    Comment: occ   one every 2 months   Drug use: No   Sexual activity: Not Currently    Birth control/protection: Post-menopausal  Other Topics Concern   Not on file  Social History Narrative   Not on file   Social Drivers of Health   Tobacco Use: Medium Risk (06/30/2024)   Patient History    Smoking Tobacco Use: Former    Smokeless Tobacco Use: Never    Passive Exposure: Past  Physicist, Medical Strain: Low Risk (08/16/2023)   Overall Financial Resource Strain (CARDIA)    Difficulty of Paying Living Expenses: Not hard at all  Food Insecurity: Low Risk (10/12/2023)   Received from [facility]   Epic    Within the past 12 months, you worried that your food would run out before you got money to buy more: Never true    Within the past 12 months, the food you bought just didn't last and you didn't have money to get more. : Never true  Transportation Needs: No Transportation Needs (10/12/2023)   Received from [facility]    In the past 12 months, has lack of reliable transportation kept you from  medical appointments, meetings, work or from getting things needed for daily living? : No  Physical Activity: Sufficiently Active (08/16/2023)   Exercise Vital Sign    Days of Exercise per Week: 5 days    Minutes of Exercise per Session: 30 min  Stress: No Stress Concern Present (08/16/2023)   [employer] of Occupational Health - Occupational Stress Questionnaire    Feeling of Stress : Not at all  Social Connections: Socially Isolated (08/16/2023)   Social Connection and Isolation Panel    Frequency of Communication with Friends and Family: More than three times a week    Frequency of Social Gatherings with Friends and Family: Three times a week    Attends Religious Services: Never    Active Member of Clubs or Organizations: No    Attends Banker Meetings: Not on file    Marital Status: Divorced  Depression (PHQ2-9): Low Risk (11/11/2022)    Depression (PHQ2-9)    PHQ-2 Score: 0  Alcohol Screen: Low Risk (08/16/2023)   Alcohol Screen    Last Alcohol Screening Score (AUDIT): 1  Housing: Low Risk (10/12/2023)   Received from [facility]   Epic    What is your living situation today?: I have a steady place to live    Think about the place you live. Do you have problems with any of the following? Choose all that apply:: None/None on this list  Utilities: Low Risk (10/12/2023)   Received from [facility]   Utilities    In the past 12 months has the electric, gas, oil, or water company threatened to shut off services in your home? : No  Health Literacy: Not on file   Family History  Problem Relation Age of Onset   Stomach cancer Mother    Alcohol abuse Mother    Alcohol abuse Father    Heart disease Father    COPD Sister    Arthritis Sister    Alcohol abuse Sister    Mental illness Sister    Heart disease Brother    Colon cancer Neg Hx    Colon polyps Neg Hx    Esophageal cancer Neg Hx    Rectal cancer Neg Hx    Allergies[1] Prior to Admission medications  Medication Sig Start Date End Date Taking? Authorizing Provider  acetaminophen  (TYLENOL ) 500 MG tablet Take 500 mg by mouth every 6 (six) hours as needed.   Yes [provider]  albuterol  (VENTOLIN  HFA) 108 (90 Base) MCG/ACT inhaler Inhale 1-2 puffs into the lungs every 6 (six) hours as needed for wheezing or shortness of breath. 09/16/23  Yes Eula Mazzola Leita Repine, FNP  CALCIUM  PO Take 1 tablet by mouth 2 (two) times daily.   Yes [provider]  cetirizine (ZYRTEC) 10 MG tablet Take 10 mg by mouth daily.   Yes [provider]  Evolocumab  (REPATHA  SURECLICK) 140 MG/ML SOAJ Inject 140 mg into the skin every 14 (fourteen) days. 06/16/24  Yes Pietro Redell RAMAN, MD  ezetimibe  (ZETIA ) 10 MG tablet Take 1 tablet (10 mg total) by mouth daily. 03/22/24  Yes Pietro Redell RAMAN, MD  Ferrous Sulfate (SLOW RELEASE IRON PO) Take 1 tablet by mouth 2  (two) times a week. Twice a week   Yes [provider]  hydrochlorothiazide  (MICROZIDE ) 12.5 MG capsule Take 1 capsule (12.5 mg total) by mouth daily. 09/16/23  Yes Nefertiti Mohamad Leita Repine, FNP  Menaquinone-7 (VITAMIN K2 PO) Take 1 tablet by mouth 2 (two) times daily.  Yes [provider]  Multiple Vitamins-Minerals (ALIVE WOMENS 50+) CHEW Chew by mouth.   Yes [provider]  Multiple Vitamins-Minerals (PRESERVISION AREDS 2 PO) Take 1 tablet by mouth in the morning and at bedtime.   Yes [provider]  Phenazopyridine HCl (AZO TABS PO) Take 1 tablet by mouth 2 (two) times daily.   Yes [provider]  traMADol  HCl 100 MG TABS Take 1 tablet by mouth 3 (three) times daily.   Yes [provider]   No results found.  Positive ROS: All other systems have been reviewed and were otherwise negative with the exception of those mentioned in the HPI and as above.  Physical Exam: General: Alert, no acute distress Cardiovascular: No pedal edema Respiratory: No cyanosis, no use of accessory musculature GI: No organomegaly, abdomen is soft and non-tender Skin: No lesions in the area of chief complaint Neurologic: Sensation intact distally Psychiatric: Patient is competent for consent with normal mood and affect Lymphatic: No axillary or cervical lymphadenopathy  MUSCULOSKELETAL: Left upper extremity:  Nicely healed deltopectoral incision.  No signs of infection.  Neurovascular tact throughout.  Assessment: Glenoid sided osteoarthritis left shoulder  Plan: Plan to proceed today with conversion of the hemiarthroplasty to reverse arthroplasty.  We discussed discharge home from PACU if she feels comfortable.  The risks, benefits, and alternatives were discussed with the patient. There are risks associated with the surgery including, but not limited to, problems with anesthesia (death), infection, differences in leg length/angulation/rotation,  fracture of bones, loosening or failure of implants, malunion, nonunion, hematoma (blood accumulation) which may require surgical drainage, blood clots, pulmonary embolism, nerve injury (foot drop), and blood vessel injury. The patient understands these risks and elects to proceed.       Selinda Belvie Gosling, MD Cell [phone]    06/30/2024 9:04 AM     [1]  Allergies Allergen Reactions   Crestor  [Rosuvastatin ]     myalgias   Lipitor [Atorvastatin ]     myalgias   Prednisone  Other (See Comments)    Made me crazy, irrational   "

## 2024-06-30 NOTE — Brief Op Note (Signed)
 06/30/2024 10:46 AM  12:53 PM  PATIENT:  Stephanie Hensley  64 y.o. female  PRE-OPERATIVE DIAGNOSIS:  Osteoarthritis of left shoulder, unspecified osteoarthritis type  POST-OPERATIVE DIAGNOSIS:  Osteoarthritis of left shoulder, unspecified osteoarthritis type  PROCEDURE:  Procedures: REVISION, REVERSE TOTAL ARTHROPLASTY, SHOULDER, CONVERSION HEMIARTHROPLASTY TO REVERSE ARTHROPLASTY (Left)  SURGEON:  Surgeons and Role:    * Sharl Selinda Dover, MD - Primary  PHYSICIAN ASSISTANT: Dayle Moores, PA-C   ANESTHESIA:   regional and general  EBL:  350 mL   BLOOD ADMINISTERED:none  DRAINS: none   LOCAL MEDICATIONS USED:  NONE  SPECIMEN:  No Specimen  DISPOSITION OF SPECIMEN:  N/A  COUNTS:  YES  TOURNIQUET:  * No tourniquets in log *  DICTATION: .Note written in EPIC  PLAN OF CARE: Discharge to home after PACU  PATIENT DISPOSITION:  PACU - hemodynamically stable.   Delay start of Pharmacological VTE agent (>24hrs) due to surgical blood loss or risk of bleeding: not applicable

## 2024-07-03 ENCOUNTER — Encounter (HOSPITAL_COMMUNITY): Payer: Self-pay | Admitting: Orthopedic Surgery
